# Patient Record
Sex: Male | Born: 1995 | Race: White | Hispanic: No | Marital: Single | Smoking: Former smoker
Health system: Southern US, Community
[De-identification: ages and names within clinical notes are randomized; demographics above are authoritative.]

## PROBLEM LIST (undated history)

## (undated) DIAGNOSIS — T7840XA Allergy, unspecified, initial encounter: Secondary | ICD-10-CM

## (undated) DIAGNOSIS — J45909 Unspecified asthma, uncomplicated: Secondary | ICD-10-CM

## (undated) DIAGNOSIS — F988 Other specified behavioral and emotional disorders with onset usually occurring in childhood and adolescence: Secondary | ICD-10-CM

## (undated) DIAGNOSIS — R51 Headache: Secondary | ICD-10-CM

## (undated) HISTORY — DX: Headache: R51

## (undated) HISTORY — PX: TYMPANOSTOMY TUBE PLACEMENT: SHX32

## (undated) HISTORY — PX: URETHRAL STRICTURE DILATATION: SHX477

## (undated) HISTORY — DX: Allergy, unspecified, initial encounter: T78.40XA

## (undated) HISTORY — DX: Unspecified asthma, uncomplicated: J45.909

## (undated) HISTORY — DX: Other specified behavioral and emotional disorders with onset usually occurring in childhood and adolescence: F98.8

## (undated) HISTORY — PX: OTHER SURGICAL HISTORY: SHX169

---
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
## 2016-03-29 ENCOUNTER — Telehealth: Payer: Self-pay

## 2016-03-29 NOTE — Telephone Encounter (Signed)
Spoke with patients mother. Faxed [facility]. Receptionist with [facility] stated to request ASAP.

## 2016-03-30 ENCOUNTER — Encounter: Payer: Self-pay | Admitting: Medical

## 2016-03-30 ENCOUNTER — Ambulatory Visit (INDEPENDENT_AMBULATORY_CARE_PROVIDER_SITE_OTHER): Payer: No Typology Code available for payment source | Admitting: Medical

## 2016-03-30 VITALS — BP 120/80 | HR 89 | Temp 98.1°F | Ht 62.0 in | Wt 142.2 lb

## 2016-03-30 DIAGNOSIS — F988 Other specified behavioral and emotional disorders with onset usually occurring in childhood and adolescence: Secondary | ICD-10-CM

## 2016-03-30 DIAGNOSIS — J309 Allergic rhinitis, unspecified: Secondary | ICD-10-CM

## 2016-03-30 DIAGNOSIS — G4700 Insomnia, unspecified: Secondary | ICD-10-CM

## 2016-03-30 DIAGNOSIS — F909 Attention-deficit hyperactivity disorder, unspecified type: Secondary | ICD-10-CM

## 2016-03-30 DIAGNOSIS — Z8709 Personal history of other diseases of the respiratory system: Secondary | ICD-10-CM

## 2016-03-30 DIAGNOSIS — Z5181 Encounter for therapeutic drug level monitoring: Secondary | ICD-10-CM

## 2016-03-30 LAB — COMPREHENSIVE METABOLIC PANEL
ALBUMIN: 4.6 g/dL (ref 3.5–5.2)
ALT: 84 U/L — ABNORMAL HIGH (ref 0–53)
AST: 41 U/L — ABNORMAL HIGH (ref 0–37)
Alkaline Phosphatase: 131 U/L (ref 52–171)
BILIRUBIN TOTAL: 1.1 mg/dL (ref 0.2–1.2)
BUN: 16 mg/dL (ref 6–23)
CALCIUM: 9.6 mg/dL (ref 8.4–10.5)
CHLORIDE: 103 meq/L (ref 96–112)
CO2: 30 meq/L (ref 19–32)
CREATININE: 0.76 mg/dL (ref 0.40–1.50)
GFR: 140.22 mL/min (ref >60.00)
Glucose, Bld: 70 mg/dL (ref 70–99)
Potassium: 3.8 mEq/L (ref 3.5–5.1)
Sodium: 140 mEq/L (ref 135–145)
Total Protein: 7.3 g/dL (ref 6.0–8.3)

## 2016-03-30 MED ORDER — FEXOFENADINE HCL 60 MG PO TABS: 60.0000 mg | ORAL_TABLET | Freq: Every day | ORAL | Status: DC

## 2016-03-30 MED ORDER — FLUTICASONE PROPIONATE 50 MCG/ACT NA SUSP: 2.0000 | Freq: Every day | NASAL | Status: DC

## 2016-03-30 MED ORDER — MONTELUKAST SODIUM 5 MG PO CHEW: 5.0000 mg | CHEWABLE_TABLET | Freq: Every day | ORAL | Status: DC

## 2016-03-30 MED ORDER — MIRTAZAPINE 15 MG PO TABS: 22.5000 mg | ORAL_TABLET | Freq: Every day | ORAL | Status: DC

## 2016-03-30 MED ORDER — AMANTADINE HCL 100 MG PO TABS: ORAL_TABLET | ORAL | Status: DC

## 2016-03-30 MED ORDER — ALBUTEROL SULFATE HFA 108 (90 BASE) MCG/ACT IN AERS: 2.0000 | INHALATION_SPRAY | Freq: Four times a day (QID) | RESPIRATORY_TRACT | Status: AC | PRN

## 2016-03-30 NOTE — Progress Notes (Signed)
Subjective:    Patient ID: Jerome Sampson, male    DOB: 08/11/1996, 20 y.o.   MRN: [id number]  HPI  I have reviewed pt PMH, PSH, FH, Social History and Surgical History  Pt will attend [employer] school starts August 10 th. Pt exercises daily some sit ups, pulls ups and push ups, drinks occasional soda, single.   Pt in with mom. Pt used to be with behavioral health with his Medicaid. His former Psychiatrist was prescribing ADD medications(But they don't accept orange card). Pt and mom states he is on amantidine. This helps with his focus per mom and pt. Pt has no depression, no anxiety and no bi-polar. Pt describes poor concentration without his meds and severely inefficient without medications.  Per mom had used for 10 years.  Pt uses Remeron for insomnia.  Pt uses [facility] on [address].  Pt has some seasonal allergies year round. Hx of nasal polyps twice in past.   When youger had some asthma. When he gets infections or severe allergies wheezing will occur.  In past would get very mild ha. Will resolve with alleve. Rare occaional ha every 3 months at most. No associated gross motor or sensory function deficits. Per chart migraine but years since migraine type ha.   Pt not aware of community health and wellness.  [phone]      Review of Systems  Constitutional: Negative for fever, chills and fatigue.  HENT: Positive for congestion and postnasal drip. Negative for ear pain, sinus pressure and sneezing.        Some nasal congestion.  Respiratory: Negative for choking, shortness of breath and wheezing.        Stable and no wheezing for a while.  Neurological: Positive for headaches. Negative for dizziness, seizures and numbness.       Rare occasional.  Hematological: Negative for adenopathy. Does not bruise/bleed easily.  Psychiatric/Behavioral: Positive for sleep disturbance and decreased concentration. Negative for suicidal ideas, dysphoric mood and agitation. The patient is not  nervous/anxious.        Without meds.    Past Medical History  Diagnosis Date  . Headache(784.0)   . Allergy   . Asthma   . ADD (attention deficit disorder)      Social History   Social History  . Marital Status: Single    Spouse Name: N/A  . Number of Children: N/A  . Years of Education: N/A   Occupational History  . Not on file.   Social History Main Topics  . Smoking status: Passive Smoke Exposure - Never Smoker  . Smokeless tobacco: Never Used  . Alcohol Use: No  . Drug Use: No  . Sexual Activity: No   Other Topics Concern  . Not on file   Social History Narrative    Past Surgical History  Procedure Laterality Date  . Urethral stricture dilatation      Performed at 1 month of age  . Tympanostomy tube placement Bilateral     Performed at 20 year of age  . Removal of sinus polyps in 2010 & 2012      Family History  Problem Relation Age of Onset  . Migraines Mother   . Other Mother     Learning Difficulties in Math & Reading   . Migraines Brother     1 Brother has Migraines  . Stroke Paternal Grandfather     Died at 4684  . Cancer Paternal Grandmother     Died at 7763  No Known Allergies  Current Outpatient Prescriptions on File Prior to Visit  Medication Sig Dispense Refill  . Albuterol Sulfate (VENTOLIN HFA IN) Inhale into the lungs. 2 Puffs q 4-6 PRN    . fexofenadine (ALLEGRA) 60 MG tablet Take 60 mg by mouth every morning.    . fluticasone (FLONASE) 50 MCG/ACT nasal spray Place 2 sprays into the nose daily.    . mirtazapine (REMERON) 15 MG tablet Take 22.5 mg by mouth at bedtime.    . montelukast (SINGULAIR) 5 MG chewable tablet Chew 5 mg by mouth at bedtime.     No current facility-administered medications on file prior to visit.    BP 120/80 mmHg  Pulse 89  Temp(Src) 98.1 F (36.7 C) (Oral)  Ht 5\' 2"  (1.575 m)  Wt 142 lb 3.2 oz (64.501 kg)  BMI 26.00 kg/m2  SpO2 98%       Objective:   Physical Exam  General  Mental Status -  Alert. General Appearance - Well groomed. Not in acute distress.  Skin Rashes- No Rashes.  HEENT Head- Normal. Ear Auditory Canal - Left- Normal. Right - Normal.Tympanic Membrane- Left- Normal. Right- Normal. Eye Sclera/Conjunctiva- Left- Normal. Right- Normal. Nose & Sinuses Nasal Mucosa- Left-  Boggy and Congested. Right-  Boggy and  Congested.Bilateral no  maxillary and no  frontal sinus pressure. Mouth & Throat Lips: Upper Lip- Normal: no dryness, cracking, pallor, cyanosis, or vesicular eruption. Lower Lip-Normal: no dryness, cracking, pallor, cyanosis or vesicular eruption. Buccal Mucosa- Bilateral- No Aphthous ulcers. Oropharynx- No Discharge or Erythema. Tonsils: Characteristics- Bilateral- No Erythema or Congestion. Size/Enlargement- Bilateral- No enlargement. Discharge- bilateral-None.  Neck Neck- Supple. No Masses.   Chest and Lung Exam Auscultation: Breath Sounds:-Clear even and unlabored.  Cardiovascular Auscultation:Rythm- Regular, rate and rhythm. Murmurs & Other Heart Sounds:Ausculatation of the heart reveal- No Murmurs.  Lymphatic Head & Neck General Head & Neck Lymphatics: Bilateral: Description- No Localized lymphadenopathy.   Neurologic Cranial Nerve exam:- CN III-XII intact(No nystagmus), symmetric smile. Strength:- 5/5 equal and symmetric strength both upper and lower extremities.       Assessment & Plan:  For your ADD will continue the prior rx of amantidine. But want to get cmp lab today.  For allergies rx flonase, allegra and singulair.  For insomina rx of remeron.   For asthma history make albuterol available if you have wheezing flare.   Follow up in 1 month or as needed  Discussed amantidine use for ADD with our pharmacist. It can be used. [facility] pharmacy confirmed as well.

## 2016-03-30 NOTE — Addendum Note (Signed)
Addended by: Neldon LabellaMABE, HOLDEN S on: 03/30/2016 02:26 PM   Modules accepted: Kipp BroodSmartSet

## 2016-03-30 NOTE — Patient Instructions (Addendum)
For your ADD will continue the prior rx of amantidine. But want to get cmp lab today.  For allergies rx flonase, allegra and singulair.  For insomina rx of remeron.   For asthma history make albuterol available if you have wheezing flare.    Follow up in 1 month or as needed

## 2016-03-30 NOTE — Progress Notes (Signed)
Pre visit review using our clinic review tool, if applicable. No additional management support is needed unless otherwise documented below in the visit note. 

## 2016-03-31 ENCOUNTER — Telehealth: Payer: Self-pay | Admitting: Medical

## 2016-03-31 MED ORDER — MONTELUKAST SODIUM 10 MG PO TABS: 10.0000 mg | ORAL_TABLET | Freq: Every day | ORAL | Status: DC

## 2016-03-31 MED ORDER — CETIRIZINE HCL 10 MG PO TABS: 10.0000 mg | ORAL_TABLET | Freq: Every day | ORAL | Status: DC

## 2016-03-31 NOTE — Telephone Encounter (Signed)
I did rx zyrtec and singulair both 10 mg dose. Let her know he can take 10 mg singulair. Wrote 5mg  since that was what he was on before. But 10 mg would be dose for his age.

## 2016-03-31 NOTE — Telephone Encounter (Signed)
Spoke with teresa, they do not have allegra at this time and she wants to know if they can change to zyrtec and they only have 10mg  doses in the singulair. She wants to know if these can be changed for the pt. Please advise.

## 2016-03-31 NOTE — Telephone Encounter (Signed)
Caller name: Aggie Cosierheresa  [facility] pt[facility]  Call back number: [phone]    Reason for call:  Would like to discuss medication prescribed as per Aggie Cosierheresa a few changes need to be done. Please advise

## 2016-04-07 ENCOUNTER — Telehealth: Payer: Self-pay | Admitting: Medical

## 2016-04-07 DIAGNOSIS — F988 Other specified behavioral and emotional disorders with onset usually occurring in childhood and adolescence: Secondary | ICD-10-CM

## 2016-04-07 DIAGNOSIS — G4700 Insomnia, unspecified: Secondary | ICD-10-CM

## 2016-04-07 NOTE — Telephone Encounter (Signed)
I am unable to put the order in the system and I advised the pt earlier that they would have to call the number that is on the sheet for behavioral health to set up for an appointment and the mom stated that she would call the number to set up the appointment for the her son who is the patient.

## 2016-04-07 NOTE — Telephone Encounter (Signed)
Caller Name: Lafonda MossesClaud Belluomini  Relation to AV:WUJWJXpt:father  Call back number:[phone]    Reason for call:  Would like to discuss medication below. Father would like a call before 12 due to him going to work.

## 2016-04-07 NOTE — Telephone Encounter (Signed)
Spoke with Trinna PostAlex and she states that they can send the Rx out to another pharmacy that can fill the Rx. They would not be able to fill the Rx and send it to the health department.

## 2016-04-07 NOTE — Telephone Encounter (Signed)
I did write referral. But will you call pt. See if they will open up as to what other dx he has. I was told just ADD and insomina. Behavioralhealth needs more info. They might need to see me next week so we can discuss this further.

## 2016-04-07 NOTE — Telephone Encounter (Addendum)
Spoke with pt's mom and she states that her son Jerome CumminsJesse would like a referral to behavioral health in our office. Pt's mom states that the chewable for Zyrtec and Singular. Please advise if ok to change to capsule form.

## 2016-04-07 NOTE — Telephone Encounter (Signed)
Yes ok to change to capsules. Apologize for me if I wrote chewable?

## 2016-04-10 ENCOUNTER — Telehealth: Payer: Self-pay | Admitting: *Deleted

## 2016-04-10 MED FILL — MONTELUKAST SOD 10 MG TAB: 10 | 30 days supply | Qty: 30 | Fill #0

## 2016-04-10 MED FILL — ?CETIRIZINE HCL 10 MG TABLE: 10 | 30 days supply | Qty: 30 | Fill #0

## 2016-04-10 NOTE — Telephone Encounter (Signed)
Forwarded to Edward/Holden. JG//CMA 

## 2016-04-25 ENCOUNTER — Encounter: Payer: Self-pay | Admitting: Medical

## 2016-04-25 ENCOUNTER — Ambulatory Visit (INDEPENDENT_AMBULATORY_CARE_PROVIDER_SITE_OTHER): Payer: No Typology Code available for payment source | Admitting: Medical

## 2016-04-25 VITALS — BP 120/80 | HR 87 | Temp 98.2°F | Ht 62.0 in | Wt 147.8 lb

## 2016-04-25 DIAGNOSIS — F988 Other specified behavioral and emotional disorders with onset usually occurring in childhood and adolescence: Secondary | ICD-10-CM

## 2016-04-25 DIAGNOSIS — F909 Attention-deficit hyperactivity disorder, unspecified type: Secondary | ICD-10-CM

## 2016-04-25 DIAGNOSIS — J309 Allergic rhinitis, unspecified: Secondary | ICD-10-CM

## 2016-04-25 DIAGNOSIS — R748 Abnormal levels of other serum enzymes: Secondary | ICD-10-CM

## 2016-04-25 MED ORDER — AMANTADINE HCL 100 MG PO CAPS: 100.0000 mg | ORAL_CAPSULE | Freq: Two times a day (BID) | ORAL | 2 refills | Status: DC

## 2016-04-25 MED FILL — AMANTADINE 100 MG CAPSULE: 100 | 30 days supply | Qty: 60 | Fill #0

## 2016-04-25 NOTE — Progress Notes (Signed)
Pre visit review using our clinic review tool, if applicable. No additional management support is needed unless otherwise documented below in the visit note. 

## 2016-04-25 NOTE — Addendum Note (Signed)
Addended by: Neldon Labella on: 04/25/2016 10:52 AM   Modules accepted: Orders

## 2016-04-25 NOTE — Addendum Note (Signed)
Addended by: Gwenevere Abbot on: 04/25/2016 10:43 AM   Modules accepted: Orders

## 2016-04-25 NOTE — Patient Instructions (Addendum)
For your ADD I will rx amantadine same dose but less frequency ddue to cost factors and potential side effects.  I want you to contact behavioral health and try to schedule a appointment.  For allergies continue flonase and singulair. Stop zyrtec. Use trial of otc xyzal.  Repeat cmp in 3 months since on amantadine. Sooner if signs or symptoms as explained.  Follow up 3 months or as needed

## 2016-04-25 NOTE — Progress Notes (Signed)
Subjective:    Patient ID: Jerome Sampson, male    DOB: 08-16-1996, 20 y.o.   MRN: [id number]  HPI  Pt in for follow up.  Pt has ADD. He was given amantadine for ADD in pat. Parent states during the day his focus is good. But late in the day his attention is bad. He can't stay on task. Very hyperactive now. Pt was given amantadine by former psychiatrist. Pt cost for med would be $200 per month now. On discussion with dad he only have ADD and learning disability. Pt dad states he is not hyperactive. Dad remembers that adderal when he was younger was just too much for him. Seemed to overstimulate him per dad.  I did review of his cmp since he was on amantadine. Mild/faint lft elevation  Dad in light of cost and slight liver enzymes wants lower dose of amantadine. Pt is in agreement with this as well.   Dad is hoping he will get him on insurance and a job. Some concern of dad that  without amantadine. He won't concentrate and then won't be able to work.   Pt also needs refill of singulair. He thinks singulair helps. Pt not sure if he needs zyrtec helps. Pt is using flonase as well. Some clearing of throat and pnd.   Review of Systems  Constitutional: Negative for chills, fatigue and fever.  HENT: Positive for postnasal drip. Negative for congestion, ear pain, rhinorrhea and sinus pressure.        Some pnd and clearing of throat.  Respiratory: Negative for cough, chest tightness, shortness of breath and wheezing.   Cardiovascular: Negative for chest pain and palpitations.  Gastrointestinal: Negative for abdominal pain.  Musculoskeletal: Negative for back pain.  Skin: Negative for rash.  Neurological: Negative for dizziness, seizures, syncope, weakness and numbness.  Hematological: Negative for adenopathy. Does not bruise/bleed easily.  Psychiatric/Behavioral: Positive for decreased concentration. Negative for agitation, behavioral problems, dysphoric mood, hallucinations, sleep disturbance  and suicidal ideas. The patient is not nervous/anxious.     Past Medical History:  Diagnosis Date  . ADD (attention deficit disorder)   . Allergy   . Asthma   . Headache(784.0)      Social History   Social History  . Marital status: Single    Spouse name: N/A  . Number of children: N/A  . Years of education: N/A   Occupational History  . Not on file.   Social History Main Topics  . Smoking status: Passive Smoke Exposure - Never Smoker  . Smokeless tobacco: Never Used  . Alcohol use No  . Drug use: No  . Sexual activity: No   Other Topics Concern  . Not on file   Social History Narrative  . No narrative on file    Past Surgical History:  Procedure Laterality Date  . Removal of Sinus Polyps in 2010 & 2012    . TYMPANOSTOMY TUBE PLACEMENT Bilateral    Performed at 20 year of age  . URETHRAL STRICTURE DILATATION     Performed at 1 month of age    Family History  Problem Relation Age of Onset  . Migraines Mother   . Other Mother     Learning Difficulties in Math & Reading   . Migraines Brother     1 Brother has Migraines  . Stroke Paternal Grandfather     Died at 11  . Cancer Paternal Grandmother     Died at 63    No Known  Allergies  Current Outpatient Prescriptions on File Prior to Visit  Medication Sig Dispense Refill  . albuterol (PROVENTIL HFA;VENTOLIN HFA) 108 (90 Base) MCG/ACT inhaler Inhale 2 puffs into the lungs every 6 (six) hours as needed for wheezing or shortness of breath. 1 Inhaler 0  . Amantadine HCl 100 MG tablet 2 tab po am. 1 tabs at lunch, and 1 tabs late afternoon 5 pm 120 tablet 0  . cetirizine (ZYRTEC) 10 MG tablet Take 1 tablet (10 mg total) by mouth daily. 30 tablet 3  . fluticasone (FLONASE) 50 MCG/ACT nasal spray Place 2 sprays into both nostrils daily. 16 g 3  . mirtazapine (REMERON) 15 MG tablet Take 1.5 tablets (22.5 mg total) by mouth at bedtime. 45 tablet 3  . montelukast (SINGULAIR) 10 MG tablet Take 1 tablet (10 mg total)  by mouth at bedtime. 30 tablet 3   No current facility-administered medications on file prior to visit.     BP 120/80 (BP Location: Right Arm, Patient Position: Sitting, Cuff Size: Normal)   Pulse 87   Temp 98.2 F (36.8 C) (Oral)   Ht 5\' 2"  (1.575 m)   Wt 147 lb 12.8 oz (67 kg)   SpO2 98%   BMI 27.03 kg/m       Objective:   Physical Exam  General Mental Status- Alert. General Appearance- Not in acute distress.   Skin General: Color- Normal Color. Moisture- Normal Moisture.  Neck Carotid Arteries- Normal color. Moisture- Normal Moisture. No carotid bruits. No JVD.  Chest and Lung Exam Auscultation: Breath Sounds:-Normal.  Cardiovascular Auscultation:Rythm- Regular. Murmurs & Other Heart Sounds:Auscultation of the heart reveals- No Murmurs.  Abdomen Inspection:-Inspeection Normal. Palpation/Percussion:Note:No mass. Palpation and Percussion of the abdomen reveal- Non Tender, Non Distended + BS, no rebound or guarding.    Neurologic Cranial Nerve exam:- CN III-XII intact(No nystagmus), symmetric smile. Strength:- 5/5 equal and symmetric strength both upper and lower extremities.      Assessment & Plan:  For your ADD I will rx amantadine same dose but less frequency due to cost factors and potential side effects.  I want you to contact behavioral health and try to schedule a appointment.  For allergies continue flonase and singulair. Stop zyrtec. Use trial of otc xyzal.  Repeat cmp in 3 months since on amantadine. Sooner if signs or symptoms as explained.  Follow up 3 months or as needed  Lasonya Hubner, Ramon Dredge, [employer]

## 2016-05-18 ENCOUNTER — Encounter: Payer: Self-pay | Admitting: Medical

## 2016-05-18 ENCOUNTER — Ambulatory Visit (HOSPITAL_BASED_OUTPATIENT_CLINIC_OR_DEPARTMENT_OTHER)
Admission: RE | Admit: 2016-05-18 | Discharge: 2016-05-18 | Disposition: A | Discharge status: Home / Self Care | Payer: No Typology Code available for payment source | Source: Ambulatory Visit | Attending: Medical | Admitting: Medical

## 2016-05-18 ENCOUNTER — Telehealth: Payer: Self-pay | Admitting: Medical

## 2016-05-18 ENCOUNTER — Ambulatory Visit (INDEPENDENT_AMBULATORY_CARE_PROVIDER_SITE_OTHER): Payer: No Typology Code available for payment source | Admitting: Medical

## 2016-05-18 VITALS — BP 122/70 | HR 116 | Temp 99.5°F | Ht 63.0 in | Wt 144.0 lb

## 2016-05-18 DIAGNOSIS — N50811 Right testicular pain: Secondary | ICD-10-CM

## 2016-05-18 DIAGNOSIS — N451 Epididymitis: Secondary | ICD-10-CM

## 2016-05-18 MED ORDER — CIPROFLOXACIN HCL 500 MG PO TABS: 500.0000 mg | ORAL_TABLET | Freq: Two times a day (BID) | ORAL | 0 refills | Status: DC

## 2016-05-18 MED ORDER — LEVOCETIRIZINE DIHYDROCHLORIDE 5 MG PO TABS: 5.0000 mg | ORAL_TABLET | Freq: Every evening | ORAL | 2 refills | Status: DC

## 2016-05-18 MED FILL — LEVOCETIRIZINE 5 MG TABLET: 5 | 30 days supply | Qty: 30 | Fill #0

## 2016-05-18 MED FILL — ?CIPROFLOXACIN HCL 500MG TA: 500 | 10 days supply | Qty: 20 | Fill #0

## 2016-05-18 NOTE — Telephone Encounter (Signed)
Please schedule recheck/folllow up for this coming wed in am. He should be in office tomorrow for rocephin 1 gram injection.

## 2016-05-18 NOTE — Patient Instructions (Addendum)
Your pain is severe and increasing(concern for orchritis vs torsion?). I am trying to get you in with urologist today or at latest tomorrow.   We need to refer you but need to coordinate referral [facility]. Number is [phone]. Please call them later today to see if they have arrange.  Rx to be determined but want to get stat US first. Stay in radiology until we get results.  Follow date to be determined.  If referral to urologist delayed but pain increasing tomorrow or over weekend then ED evaluation.

## 2016-05-18 NOTE — Telephone Encounter (Signed)
Denise---please call patient and schedule follow up appt on 05/24/16 with Ramon DredgeEdward.  Nurse visit for tomorrow (05/19/16) already scheduled. Thanks.

## 2016-05-18 NOTE — Progress Notes (Signed)
Pre visit review using our clinic tool,if applicable. No additional management support is needed unless otherwise documented below in the visit note.  

## 2016-05-18 NOTE — Addendum Note (Signed)
Addended by: Gwenevere AbbotSAGUIER, Antonietta Lansdowne M on: 05/18/2016 12:45 PM   Modules accepted: Orders

## 2016-05-18 NOTE — Progress Notes (Addendum)
Subjective:    Patient ID: Jerome Sampson, male    DOB: 06/27/1996, 20 y.o.   MRN: [id number]  HPI  Pt in stating 5-6 days ago he was doing some exercises and felt some pain afterwards.  He was doing pull ups and sit ups. Some squats with bodyweight. Pt was mild and intermittent.  No pain lying flat on his back. Sometimes will hurt particular positions while sleeping.  Sitting can feel pain. Standing and walking pain is more intense.  In shower today felt sharp high level  pain and had  nausea and dry heave.    Review of Systems  Constitutional: Negative for chills and fever.  Respiratory: Negative for cough, chest tightness and wheezing.   Cardiovascular: Negative for chest pain and palpitations.  Gastrointestinal: Negative for abdominal pain.  Genitourinary: Positive for testicular pain. Negative for decreased urine volume, dysuria, flank pain, frequency, genital sores, penile pain, penile swelling and urgency.  Musculoskeletal: Negative for back pain.  Skin: Negative for rash.  Hematological: Negative for adenopathy. Does not bruise/bleed easily.    Past Medical History:  Diagnosis Date  . ADD (attention deficit disorder)   . Allergy   . Asthma   . Headache(784.0)      Social History   Social History  . Marital status: Single    Spouse name: N/A  . Number of children: N/A  . Years of education: N/A   Occupational History  . Not on file.   Social History Main Topics  . Smoking status: Former Games developermoker  . Smokeless tobacco: Never Used  . Alcohol use No  . Drug use: No  . Sexual activity: No   Other Topics Concern  . Not on file   Social History Narrative  . No narrative on file    Past Surgical History:  Procedure Laterality Date  . Removal of Sinus Polyps in 2010 & 2012    . TYMPANOSTOMY TUBE PLACEMENT Bilateral    Performed at 20 year of age  . URETHRAL STRICTURE DILATATION     Performed at 1 month of age    Family History  Problem Relation Age of  Onset  . Migraines Mother   . Other Mother     Learning Difficulties in Math & Reading   . Migraines Brother     1 Brother has Migraines  . Stroke Paternal Grandfather     Died at 1584  . Cancer Paternal Grandmother     Died at 6263    No Known Allergies  Current Outpatient Prescriptions on File Prior to Visit  Medication Sig Dispense Refill  . albuterol (PROVENTIL HFA;VENTOLIN HFA) 108 (90 Base) MCG/ACT inhaler Inhale 2 puffs into the lungs every 6 (six) hours as needed for wheezing or shortness of breath. 1 Inhaler 0  . amantadine (SYMMETREL) 100 MG capsule Take 1 capsule (100 mg total) by mouth 2 (two) times daily. 60 capsule 2  . fluticasone (FLONASE) 50 MCG/ACT nasal spray Place 2 sprays into both nostrils daily. 16 g 3  . mirtazapine (REMERON) 15 MG tablet Take 1.5 tablets (22.5 mg total) by mouth at bedtime. 45 tablet 3  . montelukast (SINGULAIR) 10 MG tablet Take 1 tablet (10 mg total) by mouth at bedtime. 30 tablet 3  . cetirizine (ZYRTEC) 10 MG tablet Take 1 tablet (10 mg total) by mouth daily. (Patient not taking: Reported on 05/18/2016) 30 tablet 3   No current facility-administered medications on file prior to visit.     BP 122/70  Pulse (!) 116   Temp 99.5 F (37.5 C)   Ht 5\' 3"  (1.6 m)   Wt 144 lb (65.3 kg)   SpO2 100%   BMI 25.51 kg/m       Objective:   Physical Exam  General- No acute distress. Pleasant patient. Neck- Full range of motion, no jvd Lungs- Clear, even and unlabored. Heart- regular rate and rhythm. Neurologic- CNII- XII grossly intact. Abd-soft,nt, nd, +bs. No rt lower quadrant pain at all. No heel jar pain   Genital exam- uncircumcised. Rt testicle feels very swollen. Very tender to light  palpation. Can't  access the inguinal canal due to  Large size of testicle and pain. Lt testicle normal in size.       Assessment & Plan:  Your pain is severe and increasing(concern for orchitis vs torsion?). I am trying to get you in with urologist  today or at latest tomorrow.   We need to refer you but need to coordinate referral [facility]. Number is [phone]. Please call them later today to see if they have arrange.  Rx to be determined but want to get stat US first. Stay in radiology until we get results.  Follow date to be determined.  If referral to urologist delayed but pain increasing tomorrow or over weekend then ED evaluation.  Pt US finding consistent with epididymitis. Will rx cipro and diclofenac. Wanted to give rocephin 1 gram im today in office. But they can't do wait time since his ride/grandad will be late to work so he will get rocephin tomorrow am. Still advised in event testicle pain worsens despite treatment then ED evaluation. [facility] did not set urologist appointment up yet. So will ask Victorino DikeJennifer to put that on hold. He may not need. Would refer if he does not respond to above treatment.  Sidnee Gambrill, Ramon DredgeEdward, PA-C

## 2016-05-19 ENCOUNTER — Ambulatory Visit (INDEPENDENT_AMBULATORY_CARE_PROVIDER_SITE_OTHER): Payer: No Typology Code available for payment source | Admitting: Behavioral Health

## 2016-05-19 DIAGNOSIS — N451 Epididymitis: Secondary | ICD-10-CM

## 2016-05-19 MED ORDER — CEFTRIAXONE SODIUM 1 G IJ SOLR
1.0000 g | Freq: Once | INTRAMUSCULAR | Status: AC
  Administered 2016-05-19: 1 g via INTRAMUSCULAR

## 2016-05-19 NOTE — Progress Notes (Signed)
Pre visit review using our clinic review tool, if applicable. No additional management support is needed unless otherwise documented below in the visit note.  Patient in clinic today for Rocephin injection per telephone note 05/18/16. Patient tolerated injection well and has been scheduled for a follow-up visit with Esperanza RichtersEdward Saguier, PA-C on 05/24/16 at 9:00 AM.

## 2016-05-22 NOTE — Telephone Encounter (Signed)
Appt scheduled by Jannet Askewonnie Byrd, RN during nurse visit.

## 2016-05-24 ENCOUNTER — Telehealth: Payer: Self-pay | Admitting: Medical

## 2016-05-24 ENCOUNTER — Ambulatory Visit: Payer: No Typology Code available for payment source | Admitting: Medical

## 2016-05-24 NOTE — Telephone Encounter (Signed)
Called patient regarding no show policy. Left message regarding how he was feeling and if he needed to reschedule.

## 2016-05-24 NOTE — Telephone Encounter (Signed)
No charge but would someone but would someone kindely remind of no show policy and see how he is doing?  Ebony note above is no charge.

## 2016-05-24 NOTE — Telephone Encounter (Signed)
Patient lvm 05/24/16 at 10:14am cancelling 9am appointment for today.charge or no charge

## 2016-05-26 MED FILL — AMANTADINE 100 MG CAPSULE: 100 | 30 days supply | Qty: 60 | Fill #1

## 2016-05-26 NOTE — Telephone Encounter (Signed)
Spoke with patient,states he is feeling better and swelling gone down in testicle

## 2016-06-08 ENCOUNTER — Telehealth: Payer: Self-pay | Admitting: Medical

## 2016-06-08 NOTE — Telephone Encounter (Signed)
Caller name: Claud Relationship to patient: Father Can be reached: [phone]  Ph[facility]:  [facility] [place] [phone] (Phone) [phone] (Fax)     Reason for call: Father called stating that the patient is experiencing the same symptoms that he was seen for by Ramon DredgeEdward 3 weeks ago. He appears to he has completed the antibiotics and wants to know what to do. Plse adv

## 2016-06-09 ENCOUNTER — Ambulatory Visit (INDEPENDENT_AMBULATORY_CARE_PROVIDER_SITE_OTHER): Payer: No Typology Code available for payment source | Admitting: Medical

## 2016-06-09 ENCOUNTER — Encounter: Payer: Self-pay | Admitting: Medical

## 2016-06-09 VITALS — BP 122/66 | HR 93 | Temp 99.0°F | Ht 63.0 in | Wt 141.2 lb

## 2016-06-09 DIAGNOSIS — N451 Epididymitis: Secondary | ICD-10-CM

## 2016-06-09 MED ORDER — CEFTRIAXONE SODIUM 1 G IJ SOLR
1.0000 g | Freq: Once | INTRAMUSCULAR | Status: AC
  Administered 2016-06-09: 1 g via INTRAMUSCULAR

## 2016-06-09 MED ORDER — MIRTAZAPINE 15 MG PO TABS: 22.5000 mg | ORAL_TABLET | Freq: Every day | ORAL | 3 refills | Status: DC

## 2016-06-09 MED ORDER — CIPROFLOXACIN HCL 500 MG PO TABS: 500.0000 mg | ORAL_TABLET | Freq: Two times a day (BID) | ORAL | 0 refills | Status: DC

## 2016-06-09 MED FILL — CIPROFLOXACIN HCL 500 MG TA: 500 | 10 days supply | Qty: 20 | Fill #0

## 2016-06-09 NOTE — Progress Notes (Signed)
Subjective:    Patient ID: Jerome Sampson, male    DOB: Feb 06, 1996, 20 y.o.   MRN: [id number]  HPI   Pt in for follow up.   Pt had some epididymitis. I gave rocephin and cipro antibiotic. Pt states he felt almost 100% better but then last 3 days ago he has had recurrent mild pain. He is concerned for reoccurence.  Pt had [place] studies and consistent with epididymitis. Mild pain presently.  No torsion seen on last [place].  [facility] pt remeron. [facility]    Review of Systems  Constitutional: Positive for chills. Negative for fatigue and fever.       Maybe mild cold chill yesterday.  Respiratory: Negative for chest tightness, shortness of breath and wheezing.   Cardiovascular: Negative for chest pain and palpitations.  Gastrointestinal: Negative for abdominal pain and nausea.  Musculoskeletal: Negative for back pain and neck stiffness.  Skin: Negative for rash.  Neurological: Negative for dizziness, syncope, speech difficulty, numbness and headaches.  Hematological: Negative for adenopathy. Does not bruise/bleed easily.  Psychiatric/Behavioral: Negative for behavioral problems and confusion.    Past Medical History:  Diagnosis Date  . ADD (attention deficit disorder)   . Allergy   . Asthma   . Headache(784.0)      Social History   Social History  . Marital status: Single    Spouse name: N/A  . Number of children: N/A  . Years of education: N/A   Occupational History  . Not on file.   Social History Main Topics  . Smoking status: Former Games developer  . Smokeless tobacco: Never Used  . Alcohol use No  . Drug use: No  . Sexual activity: No   Other Topics Concern  . Not on file   Social History Narrative  . No narrative on file    Past Surgical History:  Procedure Laterality Date  . Removal of Sinus Polyps in 2010 & 2012    . TYMPANOSTOMY TUBE PLACEMENT Bilateral    Performed at 20 year of  age  . URETHRAL STRICTURE DILATATION     Performed at 1 month of age    Family History  Problem Relation Age of Onset  . Migraines Mother   . Other Mother     Learning Difficulties in Math & Reading   . Migraines Brother     1 Brother has Migraines  . Stroke Paternal Grandfather     Died at 31  . Cancer Paternal Grandmother     Died at 58    No Known Allergies  Current Outpatient Prescriptions on File Prior to Visit  Medication Sig Dispense Refill  . albuterol (PROVENTIL HFA;VENTOLIN HFA) 108 (90 Base) MCG/ACT inhaler Inhale 2 puffs into the lungs every 6 (six) hours as needed for wheezing or shortness of breath. 1 Inhaler 0  . amantadine (SYMMETREL) 100 MG capsule Take 1 capsule (100 mg total) by mouth 2 (two) times daily. 60 capsule 2  . fluticasone (FLONASE) 50 MCG/ACT nasal spray Place 2 sprays into both nostrils daily. 16 g 3  . levocetirizine (XYZAL) 5 MG tablet Take 1 tablet (5 mg total) by mouth every evening. generic 30 tablet 2  . mirtazapine (REMERON) 15 MG tablet Take 1.5 tablets (22.5 mg total) by mouth at bedtime. 45 tablet 3  . montelukast (SINGULAIR) 10 MG tablet Take 1 tablet (10 mg total) by mouth at bedtime. 30 tablet 3  .  ciprofloxacin (CIPRO) 500 MG tablet Take 1 tablet (500 mg total) by mouth 2 (two) times daily. (Patient not taking: Reported on 06/09/2016) 20 tablet 0   No current facility-administered medications on file prior to visit.     BP 122/66   Pulse 93   Temp 99 F (37.2 C) (Oral)   Ht 5\' 3"  (1.6 m)   Wt 141 lb 3.2 oz (64 kg)   SpO2 100%   BMI 25.01 kg/m       Objective:   Physical Exam  General Appearance- Not in acute distress.  HEENT Eyes- Scleraeral/Conjuntiva-bilat- Not Yellow. Mouth & Throat- Normal.  Chest and Lung Exam Auscultation: Breath sounds:-Normal. Adventitious sounds:- No Adventitious sounds.  Cardiovascular Auscultation:Rythm - Regular. Heart Sounds -Normal heart sounds.  Abdomen Inspection:-Inspection  Normal.  Palpation/Perucssion: Palpation and Percussion of the abdomen reveal- Non Tender, No Rebound tenderness, No rigidity(Guarding) and No Palpable abdominal masses.  Liver:-Normal.  Spleen:- Normal.   Back - no cva pain  Genital- rt testicle feels faint enlarged. Moderate enlarged and tender epididymitis. Left side normal.  .      Assessment & Plan:  For recurrent vs persisting epididymitis but not to same degree will give your rocephin 1 gram again and rx cipro antibiotic.   Will refer to urologist.  During interim if any severe testicle pain then ED evaluation.  Sent remeron at pt request since they can't contact pharmacy [facility] health dept.  Follow up in 10 day or as needed

## 2016-06-09 NOTE — Addendum Note (Signed)
Addended by: Gwenevere AbbotSAGUIER, Telvin Reinders M on: 06/09/2016 11:39 AM   Modules accepted: Orders

## 2016-06-09 NOTE — Patient Instructions (Signed)
For recurrent vs persisting epididymitis but not to same degree will give your rocephin 1 gram again and rx cipro antibiotic.   Will refer to urologist.  During interim if any severe testicle pain then ED evaluation.  Follow up in 10 day or as needed

## 2016-06-09 NOTE — Telephone Encounter (Signed)
Patient in today for OV.

## 2016-06-15 MED FILL — ?MIRTAZAPINE 15 MG TABLET: 15 | 30 days supply | Qty: 45 | Fill #0

## 2016-06-26 ENCOUNTER — Other Ambulatory Visit (HOSPITAL_COMMUNITY)
Admission: RE | Admit: 2016-06-26 | Discharge: 2016-06-26 | Disposition: A | Discharge status: Home / Self Care | Payer: No Typology Code available for payment source | Source: Ambulatory Visit | Attending: Medical | Admitting: Medical

## 2016-06-26 ENCOUNTER — Ambulatory Visit (INDEPENDENT_AMBULATORY_CARE_PROVIDER_SITE_OTHER): Payer: No Typology Code available for payment source | Admitting: Medical

## 2016-06-26 ENCOUNTER — Other Ambulatory Visit: Payer: Self-pay

## 2016-06-26 ENCOUNTER — Telehealth: Payer: Self-pay | Admitting: Medical

## 2016-06-26 ENCOUNTER — Encounter: Payer: Self-pay | Admitting: Medical

## 2016-06-26 VITALS — BP 115/61 | HR 84 | Temp 98.4°F | Ht 63.0 in | Wt 138.6 lb

## 2016-06-26 DIAGNOSIS — Z113 Encounter for screening for infections with a predominantly sexual mode of transmission: Secondary | ICD-10-CM | POA: Insufficient documentation

## 2016-06-26 DIAGNOSIS — N451 Epididymitis: Secondary | ICD-10-CM

## 2016-06-26 DIAGNOSIS — R300 Dysuria: Secondary | ICD-10-CM

## 2016-06-26 LAB — POCT URINALYSIS DIPSTICK
Bilirubin, UA: NEGATIVE
Glucose, UA: NEGATIVE
KETONES UA: NEGATIVE
Nitrite, UA: NEGATIVE
PH UA: 6
UROBILINOGEN UA: 4

## 2016-06-26 MED ORDER — CIPROFLOXACIN HCL 500 MG PO TABS: 500.0000 mg | ORAL_TABLET | Freq: Two times a day (BID) | ORAL | 0 refills | Status: AC

## 2016-06-26 MED ORDER — CEFTRIAXONE SODIUM 1 G IJ SOLR: 1.0000 g | Freq: Once | INTRAMUSCULAR | 0 refills | Status: AC

## 2016-06-26 MED FILL — AMANTADINE 100 MG CAPSULE: 100 | 30 days supply | Qty: 60 | Fill #2

## 2016-06-26 MED FILL — LEVOCETIRIZINE 5 MG TABLET: 5 | 30 days supply | Qty: 30 | Fill #1

## 2016-06-26 NOTE — Telephone Encounter (Signed)
Patient on schedule to see provider today.

## 2016-06-26 NOTE — Telephone Encounter (Signed)
Per last note on 06/09/16, pt was to follow-up in 10 days. Given recurring symptoms, would recommend he schedule appt w/ PCP.

## 2016-06-26 NOTE — Progress Notes (Signed)
Subjective:    Patient ID: Jerome Sampson, male    DOB: 01/25/1996, 20 y.o.   MRN: [id number]  HPI   Pt in for follow up.  Pt has had mostly resolved rt testicle pain. See those prior notes. Pt had epididymitis type symptoms in the pas rt testicle t. US test was negative except for probable epididymitis. He had some recurrent symptoms consistent with epididymitis. I put in referrals and appears the referral was delayed.  So rt side does feel better over all. But the left side is testicle has been hurting 3-4 days ago. First day pain was worse. Pain still present but not as severe. Pain mild presently.   Pt had some pain on urination today. Just a little bit. At beginning pain was worse. Today pain is less.     Review of Systems  Constitutional: Negative for chills, fatigue and fever.  Respiratory: Negative for cough, chest tightness and wheezing.   Cardiovascular: Negative for chest pain and palpitations.  Genitourinary: Positive for dysuria, frequency and testicular pain. Negative for decreased urine volume, difficulty urinating, flank pain, genital sores, penile pain, penile swelling, scrotal swelling and urgency.  Musculoskeletal: Negative for back pain.  Psychiatric/Behavioral: Negative for behavioral problems and confusion.    Past Medical History:  Diagnosis Date  . ADD (attention deficit disorder)   . Allergy   . Asthma   . Headache(784.0)      Social History   Social History  . Marital status: Single    Spouse name: N/A  . Number of children: N/A  . Years of education: N/A   Occupational History  . Not on file.   Social History Main Topics  . Smoking status: Former Games developermoker  . Smokeless tobacco: Never Used  . Alcohol use No  . Drug use: No  . Sexual activity: No   Other Topics Concern  . Not on file   Social History Narrative  . No narrative on file    Past Surgical History:  Procedure Laterality Date  . Removal of Sinus Polyps in 2010 & 2012    .  TYMPANOSTOMY TUBE PLACEMENT Bilateral    Performed at 20 year of age  . URETHRAL STRICTURE DILATATION     Performed at 1 month of age    Family History  Problem Relation Age of Onset  . Migraines Mother   . Other Mother     Learning Difficulties in Math & Reading   . Migraines Brother     1 Brother has Migraines  . Stroke Paternal Grandfather     Died at 1684  . Cancer Paternal Grandmother     Died at 3763    No Known Allergies  Current Outpatient Prescriptions on File Prior to Visit  Medication Sig Dispense Refill  . albuterol (PROVENTIL HFA;VENTOLIN HFA) 108 (90 Base) MCG/ACT inhaler Inhale 2 puffs into the lungs every 6 (six) hours as needed for wheezing or shortness of breath. 1 Inhaler 0  . amantadine (SYMMETREL) 100 MG capsule Take 1 capsule (100 mg total) by mouth 2 (two) times daily. 60 capsule 2  . ciprofloxacin (CIPRO) 500 MG tablet Take 1 tablet (500 mg total) by mouth 2 (two) times daily. 20 tablet 0  . fluticasone (FLONASE) 50 MCG/ACT nasal spray Place 2 sprays into both nostrils daily. 16 g 3  . levocetirizine (XYZAL) 5 MG tablet Take 1 tablet (5 mg total) by mouth every evening. generic 30 tablet 2  . mirtazapine (REMERON) 15 MG tablet Take  1.5 tablets (22.5 mg total) by mouth at bedtime. 45 tablet 3  . montelukast (SINGULAIR) 10 MG tablet Take 1 tablet (10 mg total) by mouth at bedtime. 30 tablet 3   No current facility-administered medications on file prior to visit.     BP 115/61   Pulse 84   Temp 98.4 F (36.9 C) (Oral)   Ht 5\' 3"  (1.6 m)   Wt 138 lb 9.6 oz (62.9 kg)   SpO2 100%   BMI 24.55 kg/m       Objective:   Physical Exam   General- No acute distress. Pleasant patient. Neck- Full range of motion, no jvd Lungs- Clear, even and unlabored. Heart- regular rate and rhythm. Neurologic- CNII- XII grossly intact.  Genital- faint left testicle pain.Left epididymus faint tender to palpation. Testicle overall does not feel swollen Rt side testicle  feels normal.       Assessment & Plan:  For your pain on urination and testicle pain will again give rocephin 1 gram and cipro antibiotic.  We will do urine culture and ancillary studies.   Ibuprofen for pain.  We have again called [facility] to coordinate the referral. The number to their office is [phone]. Contact person is [employer].  Follow up in 7-10 days or as needed  Today decided not to get left side [place] since his pain has improved since onset and minimal. Physical exam also relatively normal. But if increased pain severe then ED. If pain gradual increase then outpatient [place].  Ambriella Kitt, Ramon Dredge, PA-C

## 2016-06-26 NOTE — Telephone Encounter (Signed)
Caller name: Claud Relationship to patient: father Can be reached: either ph# in account, please try both if no answer at first call  Reason for call: pt father called stating pt feels he is getting a 3rd infection in testicle. Pt is worried about how this may affect him in the future as well. [facility] as pt father states they have not received a call. Should pt come in office for appt today with Ramon DredgeEdward?

## 2016-06-26 NOTE — Patient Instructions (Addendum)
For your pain on urination and testicle pain will again give rocephin 1 gram and cipro antibiotic.  We will do urine culture and ancillary studies.   Ibuprofen for pain.  We have again called [facility] to coordinate the referral. The numbe to their office is [phone]. Contact person is [employer] Staley.  Today decided not to get left side US since your  pain has improved since onset and minimal. Physical exam also relatively normal. But if increased pain severe then ED. If pain gradual increase then outpatient US.   Follow up in 7-10 days or as needed

## 2016-06-26 NOTE — Progress Notes (Signed)
Pre visit review using our clinic tool,if applicable. No additional management support is needed unless otherwise documented below in the visit note.  

## 2016-06-26 NOTE — Telephone Encounter (Signed)
msg was left on home # and cell # for pt to call in and schedule OV with Ramon DredgeEdward for recurring sx. I have also left msg and [facility]ed [facility]

## 2016-06-27 ENCOUNTER — Telehealth: Payer: Self-pay | Admitting: Medical

## 2016-06-27 MED ORDER — CEFTRIAXONE SODIUM 1 G IJ SOLR
1.0000 g | Freq: Once | INTRAMUSCULAR | Status: AC
  Administered 2016-06-19: 1 g via INTRAMUSCULAR

## 2016-06-27 MED FILL — CIPROFLOXACIN HCL 500 MG TA: 500 | 10 days supply | Qty: 20 | Fill #0

## 2016-06-27 NOTE — Addendum Note (Signed)
Addended by: Lurline HareARTER, Blake Vetrano E on: 06/27/2016 08:05 AM   Modules accepted: Orders

## 2016-06-27 NOTE — Telephone Encounter (Signed)
I have left a msg at [phone] for Kim, pts mom & a msg on pts cell phone. This is the correct contact # [facility] and they can leave a msg for RipleyStephanie. It is not a # that goes to a direct person.

## 2016-06-27 NOTE — Telephone Encounter (Signed)
-----   Message from Elliot Gaultiffany M Bell sent at 06/27/2016  9:45 AM EDT ----- Regarding: Jerome Sampson referral / [facility]  Contact: [phone] As per mother [facility]'t reach [facility] [phone] her son was referred. Please advise if theres an alternate #

## 2016-06-27 NOTE — Telephone Encounter (Signed)
Called pharmacy,states they just processd Cipro RX.

## 2016-06-27 NOTE — Telephone Encounter (Signed)
Caller name: Cindy Hazyatricia Ramp  Relation to pt: mother  Call back number: [facility] [facility] [place] [phone] ([phone] (Fax)    Reason for call:  Father is currently at pharmacy and as per pharmacy ciprofloxacin (CIPRO) 500 MG tablet was not received requesting office to contact to pharmacy directly. Please advise

## 2016-06-28 LAB — URINE CYTOLOGY ANCILLARY ONLY
CHLAMYDIA, DNA PROBE: NEGATIVE
Neisseria Gonorrhea: NEGATIVE
TRICH (WINDOWPATH): NEGATIVE

## 2016-06-28 NOTE — Telephone Encounter (Signed)
Received VM from [place] [facility]. She talked to referral coordinator. We sent referral 8/17 and [facility] week later [facility]. We sent a new referral 06/09/16 and that was sent to [facility]. They have to review and notify [facility] of approval or denial. She will have Stacy, her referral coordinator, follow up with [facility] and contact us and the pts mother with an update.

## 2016-06-29 LAB — CULTURE, URINE COMPREHENSIVE: ORGANISM ID, BACTERIA: NO GROWTH

## 2016-06-29 NOTE — Telephone Encounter (Signed)
Misty StanleyStacey from [facility] called and left message on my voicemail, patient is scheduled with [facility] on 07/03/16 @11am 

## 2016-06-30 LAB — URINE CYTOLOGY ANCILLARY ONLY
Bacterial vaginitis: NEGATIVE
CANDIDA VAGINITIS: NEGATIVE

## 2016-07-03 MED FILL — SULFAMETHOXAZOLE/TMP DS TAB: 800-160 | 14 days supply | Qty: 28 | Fill #0

## 2016-07-03 MED FILL — IBUPROFEN 800 MG TABLET: 800 | 5 days supply | Qty: 15 | Fill #0

## 2016-07-17 MED FILL — ?MIRTAZAPINE 15 MG TABLET: 15 | 20 days supply | Qty: 30 | Fill #1

## 2016-07-25 ENCOUNTER — Ambulatory Visit: Payer: No Typology Code available for payment source | Admitting: Medical

## 2016-08-03 ENCOUNTER — Telehealth: Payer: Self-pay | Admitting: Medical

## 2016-08-03 DIAGNOSIS — Z5181 Encounter for therapeutic drug level monitoring: Secondary | ICD-10-CM

## 2016-08-03 NOTE — Telephone Encounter (Signed)
error:[id number] ° °

## 2016-08-03 NOTE — Telephone Encounter (Signed)
Please advise on refill.

## 2016-08-03 NOTE — Telephone Encounter (Signed)
Will you go ahead and refill pt xyzal and amantadine. Same sig with same number of refills. But also  I put future cmp order in to check liver enzymes since on amantadine. Do test in 1 months.

## 2016-08-03 NOTE — Telephone Encounter (Signed)
Father Claude-  Refill Elita BooneXYZAL , Amantadine   [facility]: [facility] [place]       [phone] if needed.

## 2016-08-04 MED ORDER — AMANTADINE HCL 100 MG PO CAPS: 100.0000 mg | ORAL_CAPSULE | Freq: Two times a day (BID) | ORAL | 0 refills | Status: DC

## 2016-08-04 MED ORDER — LEVOCETIRIZINE DIHYDROCHLORIDE 5 MG PO TABS: 5.0000 mg | ORAL_TABLET | Freq: Every evening | ORAL | 2 refills | Status: DC

## 2016-08-04 MED FILL — AMANTADINE 100 MG CAPSULE: 100 | 30 days supply | Qty: 60 | Fill #0

## 2016-08-04 NOTE — Telephone Encounter (Signed)
I spoke with the patient and advised him that his Rx would be sent to the pharmacy. Pt has a lab appointment for 09/04/16.

## 2016-09-04 ENCOUNTER — Other Ambulatory Visit: Payer: Self-pay | Admitting: Medical

## 2016-09-04 ENCOUNTER — Other Ambulatory Visit (INDEPENDENT_AMBULATORY_CARE_PROVIDER_SITE_OTHER): Payer: 59

## 2016-09-04 DIAGNOSIS — Z5181 Encounter for therapeutic drug level monitoring: Secondary | ICD-10-CM | POA: Diagnosis not present

## 2016-09-04 NOTE — Telephone Encounter (Signed)
Please advise if ok to send in refills.

## 2016-09-04 NOTE — Telephone Encounter (Signed)
°  Relation to QM:VHQIpt:self Call back [phone] Pharmacy:[facility] [address] [place]  Reason for call: pt is needing rx mirtazapine (REMERON) 15 MG tablet, amantadine (SYMMETREL) 100 MG capsule, and  levocetirizine (XYZAL) 5 MG tablet, please call to confirm rx was sent

## 2016-09-05 LAB — COMPREHENSIVE METABOLIC PANEL
ALBUMIN: 4.6 g/dL (ref 3.5–5.2)
ALK PHOS: 109 U/L (ref 52–171)
ALT: 27 U/L (ref 0–53)
AST: 25 U/L (ref 0–37)
BILIRUBIN TOTAL: 0.7 mg/dL (ref 0.2–1.2)
BUN: 20 mg/dL (ref 6–23)
CALCIUM: 9.6 mg/dL (ref 8.4–10.5)
CO2: 27 mEq/L (ref 19–32)
CREATININE: 0.83 mg/dL (ref 0.40–1.50)
Chloride: 104 mEq/L (ref 96–112)
GFR: 126.09 mL/min (ref >60.00)
Glucose, Bld: 83 mg/dL (ref 70–99)
Potassium: 4.1 mEq/L (ref 3.5–5.1)
SODIUM: 141 meq/L (ref 135–145)
TOTAL PROTEIN: 7.3 g/dL (ref 6.0–8.3)

## 2016-09-05 MED ORDER — LEVOCETIRIZINE DIHYDROCHLORIDE 5 MG PO TABS: 5.0000 mg | ORAL_TABLET | Freq: Every evening | ORAL | 2 refills | Status: DC

## 2016-09-05 MED ORDER — LEVOCETIRIZINE DIHYDROCHLORIDE 5 MG PO TABS: 5.0000 mg | ORAL_TABLET | Freq: Every evening | ORAL | 3 refills | Status: DC

## 2016-09-05 MED ORDER — AMANTADINE HCL 100 MG PO CAPS: 100.0000 mg | ORAL_CAPSULE | Freq: Two times a day (BID) | ORAL | 3 refills | Status: DC

## 2016-09-05 MED ORDER — AMANTADINE HCL 100 MG PO CAPS: 100.0000 mg | ORAL_CAPSULE | Freq: Two times a day (BID) | ORAL | 0 refills | Status: DC

## 2016-09-05 MED ORDER — MIRTAZAPINE 15 MG PO TABS: 22.5000 mg | ORAL_TABLET | Freq: Every day | ORAL | 3 refills | Status: DC

## 2016-09-05 NOTE — Telephone Encounter (Signed)
Faxed over the form to cancel the medications for the patient. [facility] [address] for the patient.  Rx faxed to pharmacy.

## 2016-09-05 NOTE — Telephone Encounter (Signed)
I refilled pt meds. Remeron, amantadine and xyzal. Will you double check and make sure [facility] [address] [place]. Cancel rx to other pharmacy if sent to wrong pharmacy.

## 2016-09-05 NOTE — Telephone Encounter (Signed)
Attempted to reach the [facility] phone is not working.

## 2016-11-16 IMAGING — US US SCROTUM
1 series · 13 of 25 positions shown · non-contrast
Comparison: None.

CLINICAL DATA: Right testicular pain and swelling for 5 days

EXAM:
SCROTAL ULTRASOUND
DOPPLER ULTRASOUND OF THE TESTICLES
TECHNIQUE: Complete ultrasound examination of the testicles, epididymis, and
other scrotal structures was performed. Color and spectral Doppler
ultrasound were also utilized to evaluate blood flow to the
testicles.

[Series 1: us scrotum · 0.08mm/px · 13 of 41 slices shown]
[im 1/41]
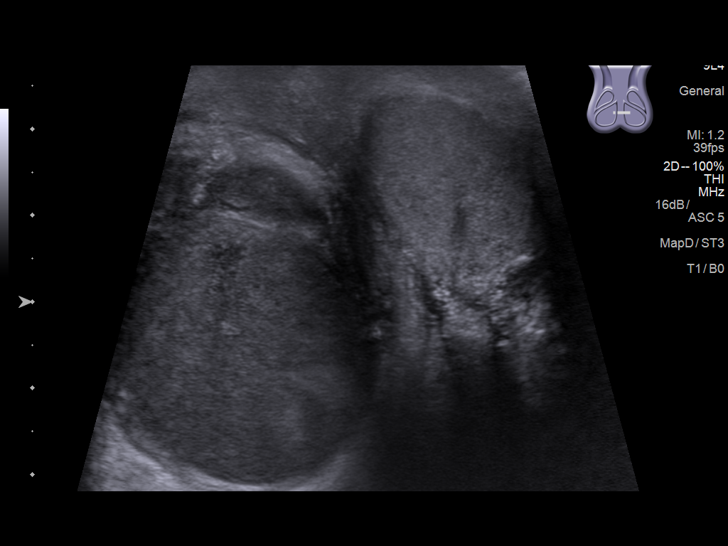
[im 4/41]
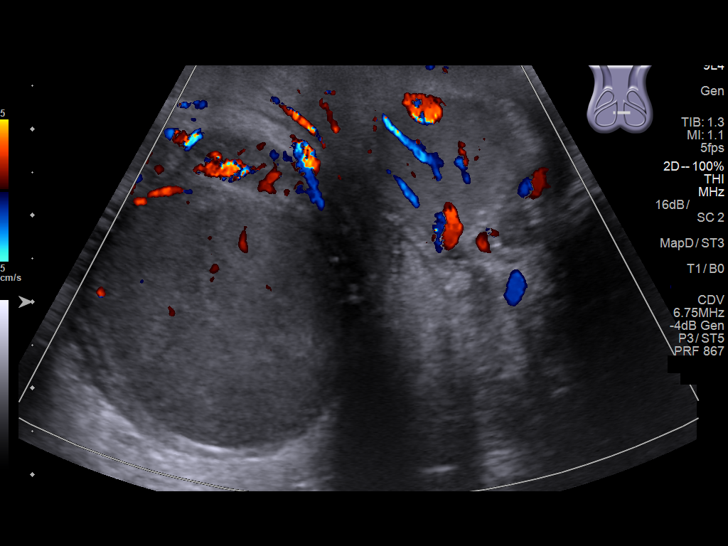
[im 7/41]
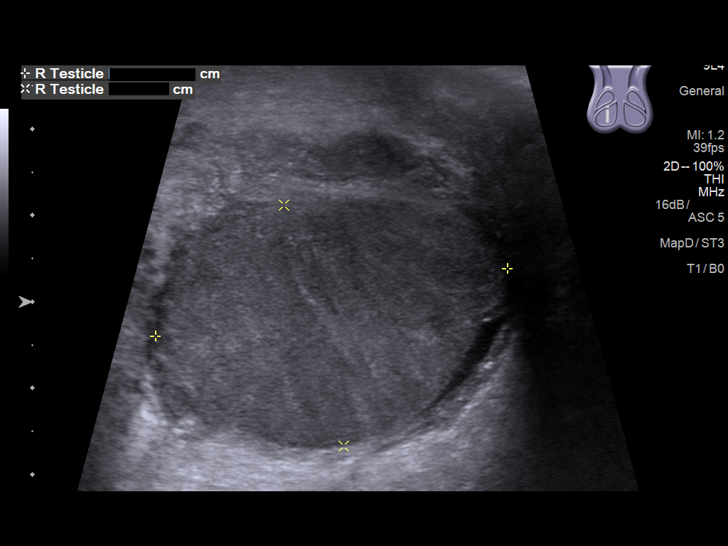
[im 11/41]
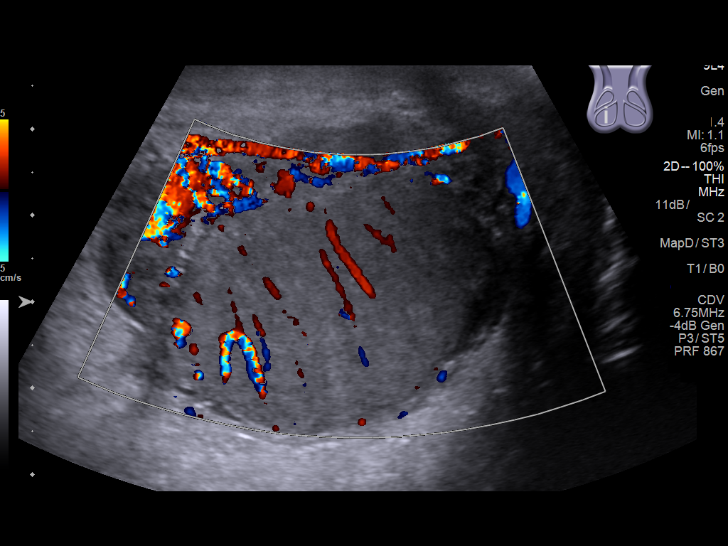
[im 14/41]
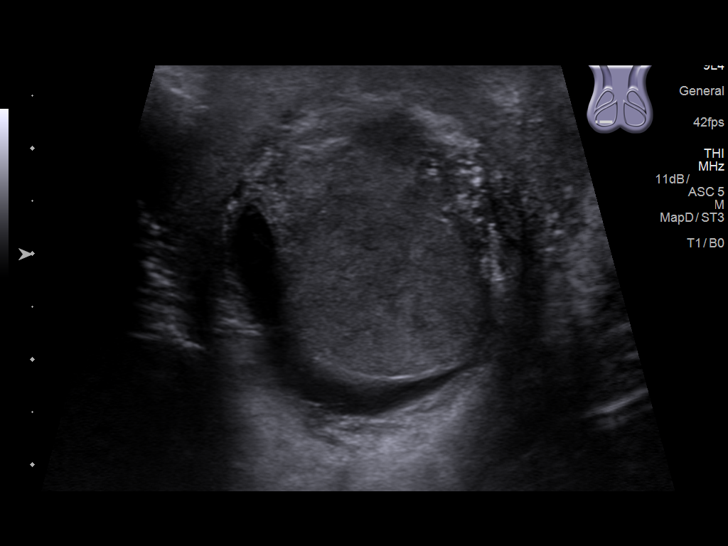
[im 17/41]
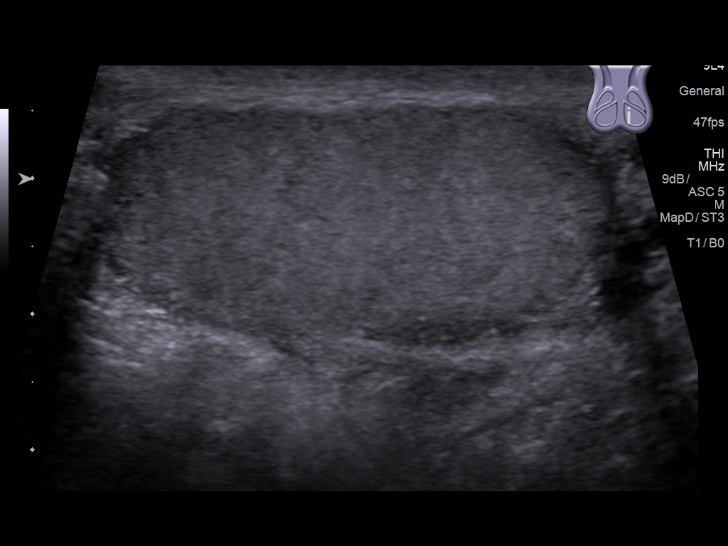
[im 21/41]
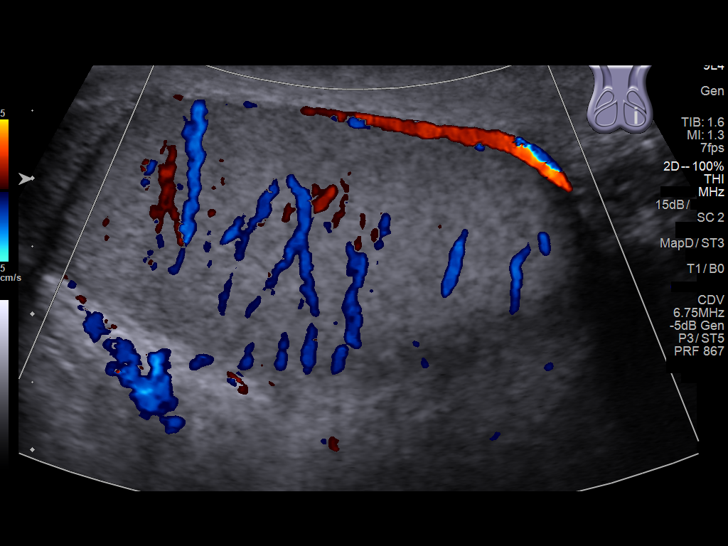
[im 24/41]
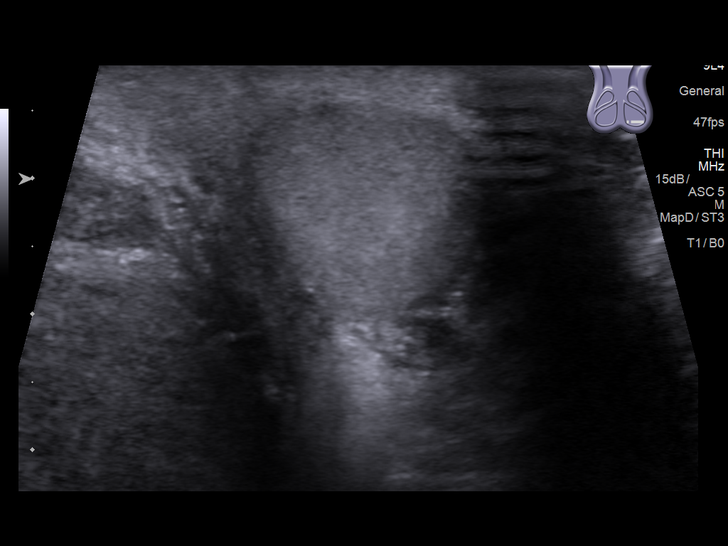
[im 27/41]
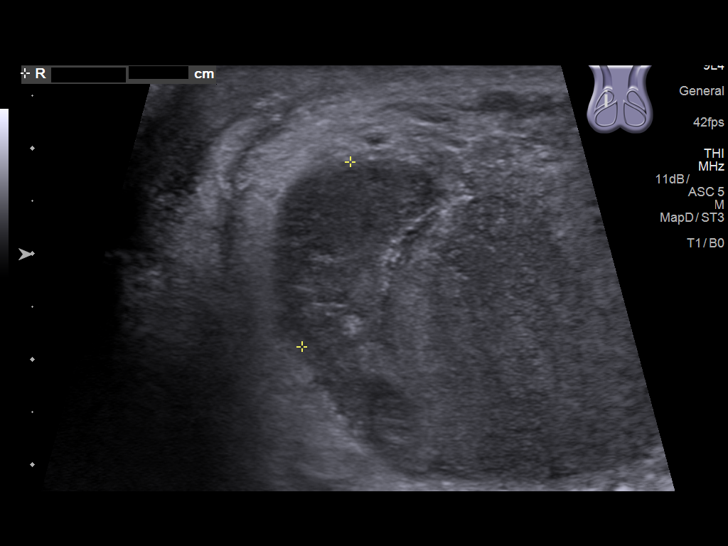
[im 31/41]
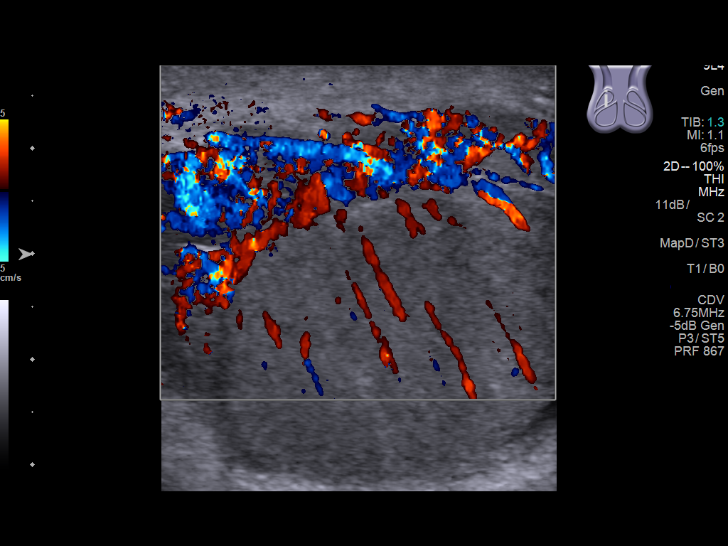
[im 34/41]
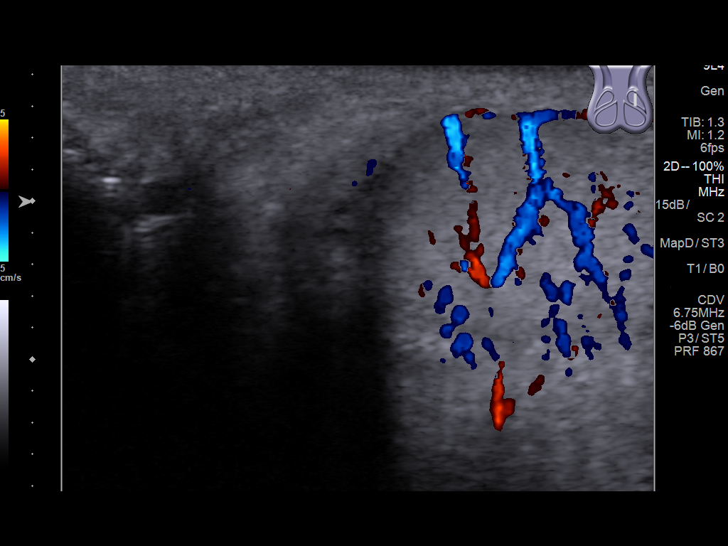
[im 37/41]
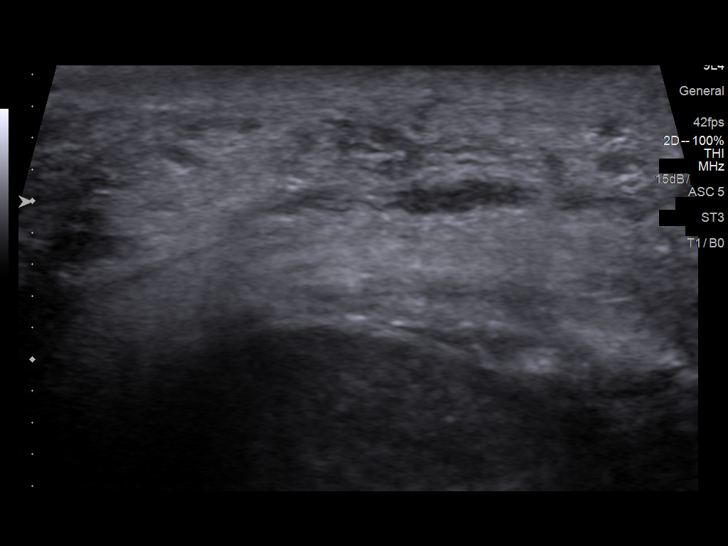
[im 41/41]
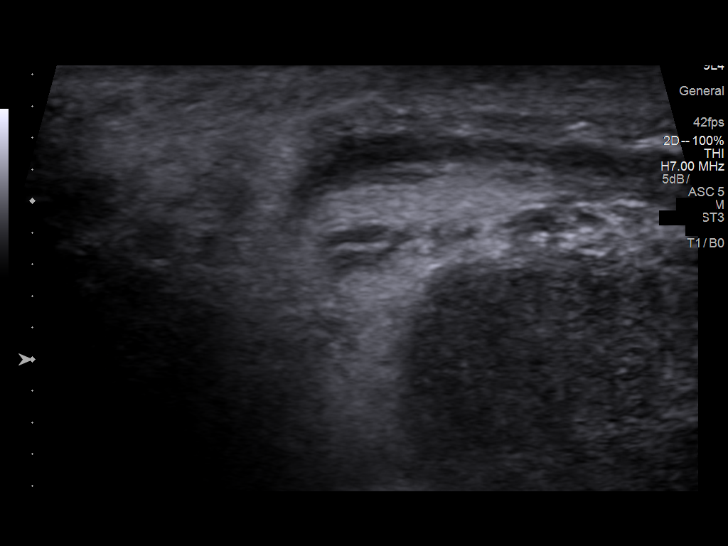

[13 of 25 positions shown; findings below may reference images not displayed]

FINDINGS: Right testicle

Measurements: The right testicle measures 4.2 x 2.9 x 2.8 cm.. No
intratesticular lesion is seen. Blood flow is demonstrated to the
right testicle with arterial and venous waveforms.

Left testicle

Measurements: The left testicle measures 3.9 x 1.8 x 2.5 cm.. No
intratesticular abnormality is seen. Blood flow is demonstrated to
the left testicle with arterial and venous waveforms.

Right epididymis: The right epididymis is prominent, inhomogeneous,
and hypervascular, findings consistent with right epididymitis.

Left epididymis:  The left epididymis is unremarkable.

Hydrocele:  A small amount of fluid is noted on the right.

Varicocele:  No varicocele is seen.

Pulsed Doppler interrogation of both testes demonstrates normal low
resistance arterial and venous waveforms bilaterally.
IMPRESSION: 1. No intratesticular abnormality is seen. Blood flow is
demonstrated to both testicles.
2. Prominent inhomogeneous hypervascular right epididymis most
consistent with right epididymitis.

## 2016-12-12 ENCOUNTER — Other Ambulatory Visit: Payer: Self-pay | Admitting: Medical

## 2016-12-14 NOTE — Telephone Encounter (Signed)
Patient states he is almost out of this medication and needs it ASAP

## 2017-01-24 ENCOUNTER — Ambulatory Visit (INDEPENDENT_AMBULATORY_CARE_PROVIDER_SITE_OTHER): Payer: PRIVATE HEALTH INSURANCE | Admitting: Medical

## 2017-01-24 VITALS — BP 122/64 | HR 80 | Temp 97.8°F | Resp 16 | Ht 63.0 in | Wt 149.6 lb

## 2017-01-24 DIAGNOSIS — Z5181 Encounter for therapeutic drug level monitoring: Secondary | ICD-10-CM

## 2017-01-24 DIAGNOSIS — K2100 Gastro-esophageal reflux disease with esophagitis, without bleeding: Secondary | ICD-10-CM

## 2017-01-24 DIAGNOSIS — J301 Allergic rhinitis due to pollen: Secondary | ICD-10-CM | POA: Diagnosis not present

## 2017-01-24 MED ORDER — AZELASTINE HCL 0.1 % NA SOLN: 2.0000 | Freq: Two times a day (BID) | NASAL | 3 refills | Status: DC

## 2017-01-24 MED ORDER — RANITIDINE HCL 150 MG PO CAPS: 150.0000 mg | ORAL_CAPSULE | Freq: Two times a day (BID) | ORAL | 0 refills | Status: AC

## 2017-01-24 NOTE — Progress Notes (Signed)
Pre visit review using our clinic review tool, if applicable. No additional management support is needed unless otherwise documented below in the visit note. 

## 2017-01-24 NOTE — Patient Instructions (Addendum)
You have some allergic rhinitis type symptoms with  some possible atypical reflux type symptoms.   I want you to continue the xyzal, singulair and flonase. Will add the astelin nasal spray. Will assess if this helps possible pnd sensation in esophagus area.  After using astelin for 3-4 days I want you to start ranitidne if any persiting irritated sensation in upper esophagus region.  Follow up in 3 weeks or as needed.  Note if your symptoms persist might need to refer to specialist for direct visualization of the esophagus.

## 2017-01-24 NOTE — Progress Notes (Signed)
Subjective:    Patient ID: Jerome Sampson, male    DOB: 08/18/1996, 21 y.o.   MRN: [id number]  HPI  Pt in for evaluation.  Pt in states he has slight constant feeling as if something in throat. He states before and after eating. Pt feels like maybe mucous drainage in back of his throat. Denies belching sensation. He states he feel like he had since youth around 21 years old.  Pt states when younger would always be clearing his throat.  Pt also mentioned that when Dr. Verdie Drown did scope evaluation. Pt described what may have been reflux changes? Pt has hx of polyps removed from his nose when younger.  Pt does have some nasal congestion. He is on flonase, xyzal and singulair.    Review of Systems  Constitutional: Negative for chills, fatigue and fever.  HENT: Positive for congestion. Negative for ear pain, facial swelling, postnasal drip, rhinorrhea, sinus pain, sinus pressure, sore throat, trouble swallowing and voice change.   Respiratory: Negative for cough, chest tightness, shortness of breath and wheezing.   Cardiovascular: Negative for chest pain and palpitations.  Gastrointestinal: Negative for abdominal pain.       See hpi.  Genitourinary: Negative for difficulty urinating, dysuria and testicular pain.  Musculoskeletal: Negative for back pain, joint swelling and neck stiffness.  Skin: Negative for rash.  Neurological: Negative for dizziness, numbness and headaches.  Psychiatric/Behavioral: Negative for behavioral problems, confusion and self-injury. The patient is not nervous/anxious.     Past Medical History:  Diagnosis Date  . ADD (attention deficit disorder)   . Allergy   . Asthma   . Headache(784.0)      Social History   Social History  . Marital status: Single    Spouse name: N/A  . Number of children: N/A  . Years of education: N/A   Occupational History  . Not on file.   Social History Main Topics  . Smoking status: Former Games developer  . Smokeless tobacco:  Never Used  . Alcohol use No  . Drug use: No  . Sexual activity: No   Other Topics Concern  . Not on file   Social History Narrative  . No narrative on file    Past Surgical History:  Procedure Laterality Date  . Removal of Sinus Polyps in 2010 & 2012    . TYMPANOSTOMY TUBE PLACEMENT Bilateral    Performed at 21 year of age  . URETHRAL STRICTURE DILATATION     Performed at 1 month of age    Family History  Problem Relation Age of Onset  . Migraines Mother   . Other Mother     Learning Difficulties in Math & Reading   . Migraines Brother     1 Brother has Migraines  . Stroke Paternal Grandfather     Died at 26  . Cancer Paternal Grandmother     Died at 63    No Known Allergies  Current Outpatient Prescriptions on File Prior to Visit  Medication Sig Dispense Refill  . albuterol (PROVENTIL HFA;VENTOLIN HFA) 108 (90 Base) MCG/ACT inhaler Inhale 2 puffs into the lungs every 6 (six) hours as needed for wheezing or shortness of breath. 1 Inhaler 0  . amantadine (SYMMETREL) 100 MG capsule Take 1 capsule (100 mg total) by mouth 2 (two) times daily. 60 capsule 3  . ciprofloxacin (CIPRO) 500 MG tablet Take 1 tablet (500 mg total) by mouth 2 (two) times daily. 20 tablet 0  . fluticasone (FLONASE) 50 MCG/ACT  nasal spray Place 2 sprays into both nostrils daily. 16 g 3  . levocetirizine (XYZAL) 5 MG tablet Take 1 tablet (5 mg total) by mouth every evening. 30 tablet 3  . mirtazapine (REMERON) 15 MG tablet Take 1.5 tablets (22.5 mg total) by mouth at bedtime. 45 tablet 3  . montelukast (SINGULAIR) 10 MG tablet Take 1 tablet (10 mg total) by mouth at bedtime. 30 tablet 3   No current facility-administered medications on file prior to visit.     BP 122/64 (BP Location: Right Arm, Patient Position: Sitting, Cuff Size: Normal)   Pulse 80   Temp 97.8 F (36.6 C) (Oral)   Resp 16   Ht  (1.6 m)   Wt 149 lb 9.6 oz (67.9 kg)   SpO2 100%   BMI 26.50 kg/m       Objective:    Physical Exam  General  Mental Status - Alert. General Appearance - Well groomed. Not in acute distress.  Skin Rashes- No Rashes.  HEENT Head- Normal. Ear Auditory Canal - Left- Normal. Right - Normal.Tympanic Membrane- Left- Normal. Right- Normal. Eye Sclera/Conjunctiva- Left- Normal. Right- Normal. Nose & Sinuses Nasal Mucosa- Left-  Boggy and Congested. Right-  Boggy and  Congested.Bilateral no  maxillary and no  frontal sinus pressure. Mouth & Throat Lips: Upper Lip- Normal: no dryness, cracking, pallor, cyanosis, or vesicular eruption. Lower Lip-Normal: no dryness, cracking, pallor, cyanosis or vesicular eruption. Buccal Mucosa- Bilateral- No Aphthous ulcers. Oropharynx- No Discharge or Erythema. +pnd. Tonsils: Characteristics- Bilateral- No Erythema or Congestion. Size/Enlargement- Bilateral- No enlargement. Discharge- bilateral-None.  Neck Neck- Supple. No Masses. No masses, no swelling.   Chest and Lung Exam Auscultation: Breath Sounds:-Clear even and unlabored.  Cardiovascular Auscultation:Rythm- Regular, rate and rhythm. Murmurs & Other Heart Sounds:Ausculatation of the heart reveal- No Murmurs.  Lymphatic Head & Neck General Head & Neck Lymphatics: Bilateral: Description- No Localized lymphadenopathy.  Abdomen-soft, nt, nd, +bs.       Assessment & Plan:  You have some allergic rhinitis type symptoms with some possible atypical reflux type symptoms.   I want you to continue the xyzal, singulair and flonase. Will add the astelin nasal spray. Will assess if this helps possible pnd sensation in esophagus area.  After using astelin for 3-4 days I want you to start ranitidne if any persiting irritated sensation in upper esophagus region.  Follow up in 3 weeks or as needed.  Note if your symptoms persist might need to refer to specialist for direct visualization of the esophagus.  [place] Jak[place], Ramon Dredge, PA-C

## 2017-01-25 LAB — COMPREHENSIVE METABOLIC PANEL
ALBUMIN: 4.7 g/dL (ref 3.5–5.2)
ALT: 40 U/L (ref 0–53)
AST: 25 U/L (ref 0–37)
Alkaline Phosphatase: 103 U/L (ref 52–171)
BUN: 18 mg/dL (ref 6–23)
CALCIUM: 10 mg/dL (ref 8.4–10.5)
CHLORIDE: 105 meq/L (ref 96–112)
CO2: 32 meq/L (ref 19–32)
CREATININE: 0.79 mg/dL (ref 0.40–1.50)
GFR: 132.95 mL/min (ref >60.00)
Glucose, Bld: 80 mg/dL (ref 70–99)
Potassium: 4.6 mEq/L (ref 3.5–5.1)
Sodium: 141 mEq/L (ref 135–145)
Total Bilirubin: 0.9 mg/dL (ref 0.2–1.2)
Total Protein: 7.4 g/dL (ref 6.0–8.3)

## 2017-02-03 ENCOUNTER — Telehealth: Payer: Self-pay | Admitting: Medical

## 2017-02-03 NOTE — Telephone Encounter (Signed)
I approved refill of remeron. Please notify patient.

## 2017-02-05 NOTE — Telephone Encounter (Signed)
Pt notified rx sent to pharmacy

## 2017-02-14 ENCOUNTER — Other Ambulatory Visit: Payer: Self-pay | Admitting: Medical

## 2017-03-15 ENCOUNTER — Other Ambulatory Visit: Payer: Self-pay | Admitting: Medical

## 2017-04-19 ENCOUNTER — Other Ambulatory Visit: Payer: Self-pay | Admitting: Medical

## 2017-05-26 ENCOUNTER — Other Ambulatory Visit: Payer: Self-pay | Admitting: Medical

## 2017-07-27 ENCOUNTER — Encounter: Payer: Self-pay | Admitting: Medical

## 2017-07-27 ENCOUNTER — Ambulatory Visit (INDEPENDENT_AMBULATORY_CARE_PROVIDER_SITE_OTHER): Payer: PRIVATE HEALTH INSURANCE | Admitting: Medical

## 2017-07-27 VITALS — BP 125/67 | HR 85 | Temp 97.9°F | Resp 16 | Ht 63.0 in | Wt 163.6 lb

## 2017-07-27 DIAGNOSIS — F988 Other specified behavioral and emotional disorders with onset usually occurring in childhood and adolescence: Secondary | ICD-10-CM

## 2017-07-27 DIAGNOSIS — R059 Cough, unspecified: Secondary | ICD-10-CM

## 2017-07-27 DIAGNOSIS — R05 Cough: Secondary | ICD-10-CM | POA: Diagnosis not present

## 2017-07-27 DIAGNOSIS — J301 Allergic rhinitis due to pollen: Secondary | ICD-10-CM

## 2017-07-27 DIAGNOSIS — J0100 Acute maxillary sinusitis, unspecified: Secondary | ICD-10-CM

## 2017-07-27 DIAGNOSIS — Z23 Encounter for immunization: Secondary | ICD-10-CM

## 2017-07-27 MED ORDER — MONTELUKAST SODIUM 10 MG PO TABS: 10.0000 mg | ORAL_TABLET | Freq: Every day | ORAL | 3 refills | Status: DC

## 2017-07-27 MED ORDER — BENZONATATE 100 MG PO CAPS: 100.0000 mg | ORAL_CAPSULE | Freq: Three times a day (TID) | ORAL | 0 refills | Status: AC | PRN

## 2017-07-27 MED ORDER — OMEPRAZOLE 20 MG PO CPDR: 20.0000 mg | DELAYED_RELEASE_CAPSULE | Freq: Every day | ORAL | 1 refills | Status: AC

## 2017-07-27 MED ORDER — AZELASTINE HCL 0.1 % NA SOLN: 2.0000 | Freq: Two times a day (BID) | NASAL | 3 refills | Status: AC

## 2017-07-27 MED ORDER — FLUTICASONE PROPIONATE 50 MCG/ACT NA SUSP: 2.0000 | Freq: Every day | NASAL | 3 refills | Status: DC

## 2017-07-27 MED ORDER — LEVOCETIRIZINE DIHYDROCHLORIDE 5 MG PO TABS: 5.0000 mg | ORAL_TABLET | Freq: Every evening | ORAL | 3 refills | Status: DC

## 2017-07-27 MED ORDER — AZITHROMYCIN 250 MG PO TABS: ORAL_TABLET | ORAL | 0 refills | Status: DC

## 2017-07-27 NOTE — Progress Notes (Signed)
Subjective:    Patient ID: Jerome Sampson, male    DOB: 09/14/1996, 21 y.o.   MRN: [id number]  HPI  Pt in for follow up.  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  Pt states his nose has been running recently over the past week. Some nasal and chest congestion. Blowing out colored mucous and coughing up some mucous. Pt has been on atselin. He has not been using xyzal. Pt admits not using his singulair. Not using any flonase.  He thinks he might need antibiotic.    He feels he no longer needs amantadine. He states his concentration has improved. His former specialist gave him this.  I later filled the prescription when he stopped going to the specialist.  Pt still reports symptom of mucus in lower throat and points to suprasternal notch area. This I thought was atypical sensation and recommended possible ranitidine to see if this helps. He noted no improvement. Last time he described sensation as if something stuck in throat. Denies any abdomen pain.     Review of Systems  Constitutional: Negative for chills, fatigue and fever.  HENT: Positive for congestion, postnasal drip, sinus pain and sinus pressure. Negative for ear pain, sore throat and trouble swallowing.   Respiratory: Negative for cough, choking, shortness of breath and wheezing.   Cardiovascular: Negative for chest pain and palpitations.  Gastrointestinal: Negative for abdominal pain, diarrhea and nausea.  Musculoskeletal: Negative for back pain and myalgias.  Skin: Negative for rash.  Neurological: Negative for dizziness, speech difficulty, weakness, numbness and headaches.  Hematological: Negative for adenopathy. Does not bruise/bleed easily.  Psychiatric/Behavioral: Negative for behavioral problems, confusion and suicidal ideas. The patient is not nervous/anxious.        Concentration better without amantadine.    Past Medical History:  Diagnosis Date  . ADD (attention deficit  disorder)   . Allergy   . Asthma   . Headache(784.0)      Social History   Social History  . Marital status: Single    Spouse name: N/A  . Number of children: N/A  . Years of education: N/A   Occupational History  . Not on file.   Social History Main Topics  . Smoking status: Former Games developermoker  . Smokeless tobacco: Never Used  . Alcohol use No  . Drug use: No  . Sexual activity: No   Other Topics Concern  . Not on file   Social History Narrative  . No narrative on file    Past Surgical History:  Procedure Laterality Date  . Removal of Sinus Polyps in 2010 & 2012    . TYMPANOSTOMY TUBE PLACEMENT Bilateral    Performed at 21 year of age  . URETHRAL STRICTURE DILATATION     Performed at 1 month of age    Family History  Problem Relation Age of Onset  . Migraines Mother   . Other Mother        Learning Difficulties in Math & Reading   . Migraines Brother        1 Brother has Migraines  . Stroke Paternal Grandfather        Died at 7884  . Cancer Paternal Grandmother        Died at 3063    No Known Allergies  Current Outpatient Prescriptions on File Prior to Visit  Medication Sig Dispense Refill  . albuterol (PROVENTIL HFA;VENTOLIN HFA) 108 (90 Base) MCG/ACT inhaler Inhale 2 puffs into the lungs  every 6 (six) hours as needed for wheezing or shortness of breath. 1 Inhaler 0  . amantadine (SYMMETREL) 100 MG capsule Take 1 capsule (100 mg total) by mouth 2 (two) times daily. 60 capsule 3  . azelastine (ASTELIN) 0.1 % nasal spray Place 2 sprays into both nostrils 2 (two) times daily. Use in each nostril as directed 30 mL 3  . ciprofloxacin (CIPRO) 500 MG tablet Take 1 tablet (500 mg total) by mouth 2 (two) times daily. 20 tablet 0  . fluticasone (FLONASE) 50 MCG/ACT nasal spray Place 2 sprays into both nostrils daily. 16 g 3  . levocetirizine (XYZAL) 5 MG tablet Take 1 tablet (5 mg total) by mouth every evening. 30 tablet 3  . mirtazapine (REMERON) 15 MG tablet TAKE 1 1/2  TABLETS BY MOUTH AT BEDTIME 45 tablet 0  . montelukast (SINGULAIR) 10 MG tablet Take 1 tablet (10 mg total) by mouth at bedtime. 30 tablet 3  . ranitidine (ZANTAC) 150 MG capsule Take 1 capsule (150 mg total) by mouth 2 (two) times daily. 60 capsule 0   No current facility-administered medications on file prior to visit.     BP 125/67   Pulse 85   Temp 97.9 F (36.6 C) (Oral)   Resp 16   Ht 5\' 3"  (1.6 m)   Wt 163 lb 9.6 oz (74.2 kg)   SpO2 100%   BMI 28.98 kg/m       Objective:   Physical Exam  General  Mental Status - Alert. General Appearance - Well groomed. Not in acute distress.  Skin Rashes- No Rashes.  HEENT Head- Normal. Ear Auditory Canal - Left- Normal. Right - Normal.Tympanic Membrane- Left- pinkish tm . Right- pinkish tm Eye Sclera/Conjunctiva- Left- Normal. Right- Normal. Nose & Sinuses Nasal Mucosa- Left-  Boggy and Congested. Right-  Boggy and  Congested.Bilateral faint maxillary and frontal sinus pressure. Mouth & Throat Lips: Upper Lip- Normal: no dryness, cracking, pallor, cyanosis, or vesicular eruption. Lower Lip-Normal: no dryness, cracking, pallor, cyanosis or vesicular eruption. Buccal Mucosa- Bilateral- No Aphthous ulcers. Oropharynx- No Discharge or Erythema. +pnd Tonsils: Characteristics- Bilateral- No Erythema or Congestion. Size/Enlargement- Bilateral- No enlargement. Discharge- bilateral-None.  Neck Neck- Supple. No Masses.   Chest and Lung Exam Auscultation: Breath Sounds:-Clear even and unlabored.  Cardiovascular Auscultation:Rythm- Regular, rate and rhythm. Murmurs & Other Heart Sounds:Ausculatation of the heart reveal- No Murmurs.  Lymphatic Head & Neck General Head & Neck Lymphatics: Bilateral: Description- No Localized lymphadenopathy.       Assessment & Plan:  For allergies use your xyzal, flonase, astelin and singulair.  I do think using all 4 of these will help eliminate your described mucous sensation in lower  throat/neck. If not then use omeprazole I am prescribing. If even with omeprazole you have mucous sensation in your throat then will refer you to ENT or GI to visualize are with scope.  For cough benzonatate.   For sinus infection and bronchitis rx azithromycin. This may also help your ears which look may be early infected.  Follow up in 10 days or prn  Pt wants to get flu vaccine. I advised/offered to get next week but he wanted today.    Drue Harr, Ramon Dredge, PA-C

## 2017-07-27 NOTE — Patient Instructions (Addendum)
For allergies use your xyzal, flonase, astelin and singulair.  I do think using all 4 of these will help eliminate your described mucous sensation in lower throat/neck. If not then use omeprazole I am prescribing. If even with omeprazole you have mucous sensation in your throat then will refer you to ENT or GI to visualize are with scope.  For cough benzonatate.   For sinus infection and bronchitis rx azithromycin. This may also help your ears which look may be early infected.  Follow up in 10 days or prn

## 2017-08-01 ENCOUNTER — Other Ambulatory Visit: Payer: Self-pay | Admitting: Medical

## 2017-08-07 ENCOUNTER — Telehealth: Payer: Self-pay | Admitting: Medical

## 2017-08-07 NOTE — Telephone Encounter (Signed)
Patient is requesting more antibiotic for congestion that was treated on 07/27/17. I let patient know that he may need to schedule another office visit. Please advise patient if we are able to give refill.

## 2017-08-08 MED ORDER — DOXYCYCLINE HYCLATE 100 MG PO TABS: 100.0000 mg | ORAL_TABLET | Freq: Two times a day (BID) | ORAL | 0 refills | Status: AC

## 2017-08-08 NOTE — Telephone Encounter (Signed)
Attempted to reach patient, no answer on cell phone.  Called home and dad answered, stated pt is at work.  Dad reports that patient has been feeling run down with chest congestion, has bad insurance and running out of money to be seen.  Advised dad I would send antibiotic to his pharmacy but that patient would need to be seen in office if symptoms worsen or if not improved in 2-3 days.  He verbalizes understanding and agrees to relay message to patient.

## 2017-09-05 ENCOUNTER — Other Ambulatory Visit: Payer: Self-pay | Admitting: Medical

## 2017-09-29 ENCOUNTER — Other Ambulatory Visit: Payer: Self-pay | Admitting: Medical

## 2017-10-01 ENCOUNTER — Telehealth: Payer: Self-pay | Admitting: Medical

## 2017-10-01 NOTE — Telephone Encounter (Signed)
Copied from CRM [phone]. Topic: Quick Communication - See Telephone Encounter >> Oct 01, 2017  8:45 AM Everardo Pacific[place]oton, Pepper Kerrick, [place] wrote: CRM for notification. See Telephone encounter for: Patient father called because his son still needs a refill on his Mirtazapine. Patient and family have been in contact with the pharmacy as well. If someone could give them a call back about this at [phone]. [facility] [address] [place]  10/01/17.

## 2017-10-28 ENCOUNTER — Other Ambulatory Visit: Payer: Self-pay | Admitting: Medical

## 2017-10-29 ENCOUNTER — Telehealth: Payer: Self-pay | Admitting: Medical

## 2017-10-31 NOTE — Telephone Encounter (Signed)
Called patient and Patient is coming in 11/01/2017 @ 6:15pm.

## 2017-10-31 NOTE — Telephone Encounter (Signed)
PT is due for follow up please call and schedule appointment.

## 2017-11-01 ENCOUNTER — Encounter: Payer: Self-pay | Admitting: Medical

## 2017-11-01 ENCOUNTER — Ambulatory Visit (INDEPENDENT_AMBULATORY_CARE_PROVIDER_SITE_OTHER): Payer: PRIVATE HEALTH INSURANCE | Admitting: Medical

## 2017-11-01 VITALS — BP 137/75 | HR 74 | Temp 97.8°F | Resp 16 | Ht 63.0 in | Wt 164.6 lb

## 2017-11-01 DIAGNOSIS — J40 Bronchitis, not specified as acute or chronic: Secondary | ICD-10-CM | POA: Diagnosis not present

## 2017-11-01 DIAGNOSIS — H6690 Otitis media, unspecified, unspecified ear: Secondary | ICD-10-CM | POA: Diagnosis not present

## 2017-11-01 DIAGNOSIS — R05 Cough: Secondary | ICD-10-CM | POA: Diagnosis not present

## 2017-11-01 DIAGNOSIS — J0100 Acute maxillary sinusitis, unspecified: Secondary | ICD-10-CM

## 2017-11-01 DIAGNOSIS — R059 Cough, unspecified: Secondary | ICD-10-CM

## 2017-11-01 MED ORDER — MIRTAZAPINE 15 MG PO TABS
ORAL_TABLET | ORAL | 3 refills | Status: AC
Start: 2017-11-01 — End: ?

## 2017-11-01 MED ORDER — AMOXICILLIN-POT CLAVULANATE 875-125 MG PO TABS: 1.0000 | ORAL_TABLET | Freq: Two times a day (BID) | ORAL | 0 refills | Status: AC

## 2017-11-01 MED ORDER — BENZONATATE 100 MG PO CAPS: 100.0000 mg | ORAL_CAPSULE | Freq: Three times a day (TID) | ORAL | 0 refills | Status: AC | PRN

## 2017-11-01 NOTE — Patient Instructions (Signed)
Your appear to have a sinus infection. I am prescribing  augmentin antibiotic for the infection. To help with the nasal congestion use your flonase nasal steroid. For your associated cough, I prescribed cough medicine benzonatate.  You do have appearance of left ear infection. Augmentin has good ear infection coverage.  Rest, hydrate, tylenol for fever.  Follow up in 7 days or as needed.

## 2017-11-01 NOTE — Progress Notes (Signed)
Subjective:    Patient ID: Durene CalJesse Byard, male    DOB: 08/17/1996, 22 y.o.   MRN: [id number]  HPI   Pt in with some nasal congestion and some productive cough. Pt has some sinus pressure. He feels like he needs to blow out mucus but can't blow out(feels like maxillary sinus are full)  Instead will cough up mucus.  Symptoms above for 2 weeks. Symptoms not improving rapidly. Just lingering.  Pt at this point thinks he needs antibiotic.    Review of Systems  Constitutional: Negative for chills, fatigue and fever.  HENT: Positive for congestion, sinus pressure and sinus pain. Negative for nosebleeds, postnasal drip and rhinorrhea.   Respiratory: Positive for cough. Negative for chest tightness, shortness of breath and wheezing.        Productive  Cardiovascular: Negative for chest pain and palpitations.  Gastrointestinal: Negative for abdominal pain, blood in stool, constipation, diarrhea and vomiting.  Musculoskeletal: Negative for back pain and joint swelling.  Neurological: Negative for dizziness, seizures, syncope and weakness.  Hematological: Negative for adenopathy. Does not bruise/bleed easily.  Psychiatric/Behavioral: Negative for behavioral problems and confusion. The patient is not nervous/anxious.     Past Medical History:  Diagnosis Date  . ADD (attention deficit disorder)   . Allergy   . Asthma   . Headache(784.0)      Social History   Socioeconomic History  . Marital status: Single    Spouse name: Not on file  . Number of children: Not on file  . Years of education: Not on file  . Highest education level: Not on file  Social Needs  . Financial resource strain: Not on file  . Food insecurity - worry: Not on file  . Food insecurity - inability: Not on file  . Transportation needs - medical: Not on file  . Transportation needs - non-medical: Not on file  Occupational History  . Not on file  Tobacco Use  . Smoking status: Former Games developermoker  . Smokeless tobacco:  Never Used  Substance and Sexual Activity  . Alcohol use: No  . Drug use: No  . Sexual activity: No  Other Topics Concern  . Not on file  Social History Narrative  . Not on file    Past Surgical History:  Procedure Laterality Date  . Removal of Sinus Polyps in 2010 & 2012    . TYMPANOSTOMY TUBE PLACEMENT Bilateral    Performed at 22 year of age  . URETHRAL STRICTURE DILATATION     Performed at 1 month of age    Family History  Problem Relation Age of Onset  . Migraines Mother   . Other Mother        Learning Difficulties in Math & Reading   . Migraines Brother        1 Brother has Migraines  . Stroke Paternal Grandfather        Died at 4884  . Cancer Paternal Grandmother        Died at 4463    No Known Allergies  Current Outpatient Medications on File Prior to Visit  Medication Sig Dispense Refill  . albuterol (PROVENTIL HFA;VENTOLIN HFA) 108 (90 Base) MCG/ACT inhaler Inhale 2 puffs into the lungs every 6 (six) hours as needed for wheezing or shortness of breath. 1 Inhaler 0  . azelastine (ASTELIN) 0.1 % nasal spray Place 2 sprays into both nostrils 2 (two) times daily. Use in each nostril as directed 30 mL 3  . benzonatate (TESSALON) 100  MG capsule Take 1 capsule (100 mg total) by mouth 3 (three) times daily as needed for cough. 21 capsule 0  . ciprofloxacin (CIPRO) 500 MG tablet Take 1 tablet (500 mg total) by mouth 2 (two) times daily. 20 tablet 0  . doxycycline (VIBRA-TABS) 100 MG tablet Take 1 tablet (100 mg total) 2 (two) times daily by mouth. 14 tablet 0  . fluticasone (FLONASE) 50 MCG/ACT nasal spray Place 2 sprays into both nostrils daily. 16 g 3  . levocetirizine (XYZAL) 5 MG tablet Take 1 tablet (5 mg total) by mouth every evening. 30 tablet 3  . mirtazapine (REMERON) 15 MG tablet TAKE 1 1/2 TABLETS BY MOUTH AT BEDTIME 45 tablet 0  . montelukast (SINGULAIR) 10 MG tablet Take 1 tablet (10 mg total) by mouth at bedtime. 30 tablet 3  . omeprazole (PRILOSEC) 20 MG  capsule Take 1 capsule (20 mg total) by mouth daily. 30 capsule 1  . ranitidine (ZANTAC) 150 MG capsule Take 1 capsule (150 mg total) by mouth 2 (two) times daily. 60 capsule 0   No current facility-administered medications on file prior to visit.     BP 137/75   Pulse 74   Temp 97.8 F (36.6 C) (Oral)   Resp 16   Ht 5\' 3"  (1.6 m)   Wt 164 lb 9.6 oz (74.7 kg)   SpO2 100%   BMI 29.16 kg/m       Objective:   Physical Exam  General  Mental Status - Alert. General Appearance - Well groomed. Not in acute distress.  Skin Rashes- No Rashes.  HEENT Head- Normal. Ear Auditory Canal - Left- Normal. Right - Normal.Tympanic Membrane- Left- moderate. Right- Normal. Eye Sclera/Conjunctiva- Left- Normal. Right- Normal. Nose & Sinuses Nasal Mucosa- Left-  Boggy and Congested. Right-  Boggy and  Congested.Bilateral maxillary and frontal sinus pressure. Mouth & Throat Lips: Upper Lip- Normal: no dryness, cracking, pallor, cyanosis, or vesicular eruption. Lower Lip-Normal: no dryness, cracking, pallor, cyanosis or vesicular eruption. Buccal Mucosa- Bilateral- No Aphthous ulcers. Oropharynx- No Discharge or Erythema. Tonsils: Characteristics- Bilateral- No Erythema or Congestion. Size/Enlargement- Bilateral- No enlargement. Discharge- bilateral-None.  Neck Neck- Supple. No Masses.   Chest and Lung Exam Auscultation: Breath Sounds:-Clear even and unlabored.  Cardiovascular Auscultation:Rythm- Regular, rate and rhythm. Murmurs & Other Heart Sounds:Ausculatation of the heart reveal- No Murmurs.  Lymphatic Head & Neck General Head & Neck Lymphatics: Bilateral: Description- No Localized lymphadenopathy.       Assessment & Plan:  Your appear to have a sinus infection. I am prescribing  augmentin antibiotic for the infection. To help with the nasal congestion use your flonase nasal steroid. For your associated cough, I prescribed cough medicine benzonatate.  You do have  appearance of left ear infection. Augmentin has good ear infection coverage.  Rest, hydrate, tylenol for fever.  Follow up in 7 days or as needed.  Esperanza Richters, PA-C

## 2017-11-29 ENCOUNTER — Other Ambulatory Visit: Payer: Self-pay | Admitting: Medical

## 2020-10-27 ENCOUNTER — Other Ambulatory Visit: Payer: Self-pay

## 2020-10-27 ENCOUNTER — Telehealth (INDEPENDENT_AMBULATORY_CARE_PROVIDER_SITE_OTHER): Payer: BC Managed Care – PPO | Admitting: Medical

## 2020-10-27 DIAGNOSIS — R0982 Postnasal drip: Secondary | ICD-10-CM | POA: Diagnosis not present

## 2020-10-27 DIAGNOSIS — J309 Allergic rhinitis, unspecified: Secondary | ICD-10-CM

## 2020-10-27 DIAGNOSIS — Z8709 Personal history of other diseases of the respiratory system: Secondary | ICD-10-CM

## 2020-10-27 MED ORDER — MONTELUKAST SODIUM 10 MG PO TABS: 10.0000 mg | ORAL_TABLET | Freq: Every day | ORAL | 0 refills | Status: AC

## 2020-10-27 MED ORDER — LEVOCETIRIZINE DIHYDROCHLORIDE 5 MG PO TABS: 5.0000 mg | ORAL_TABLET | Freq: Every evening | ORAL | 0 refills | Status: AC

## 2020-10-27 MED ORDER — MOMETASONE FUROATE 50 MCG/ACT NA SUSP: 2.0000 | Freq: Every day | NASAL | 5 refills | Status: AC

## 2020-10-27 NOTE — Patient Instructions (Signed)
History of postnasal drainage and mild nasal congestion for 2 months.  In the past had allergic rhinitis and describes history of nasal polyps for which she had to have surgery for.  We will go ahead and write Nasonex nasal spray per patient request.  Also making montelukast and Xyzal available for any persisting symptoms despite use of Nasonex.  Follow-up in 7 to 10 days or as needed  If symptoms persist may be able to see in office since signs/symptoms for 2 months and does not appear to be covid like.

## 2020-10-27 NOTE — Progress Notes (Addendum)
   Subjective:    Patient ID: Jerome Sampson, male    DOB: October 24, 1995, 25 y.o.   MRN: [id number]  HPI  Virtual Visit via Video Note  I connected with Jerome Sampson on 10/27/20 at 10:20 AM EST by a video enabled telemedicine application and verified that I am speaking with the correct person using two identifiers.  Location: Patient: home Provider: office  Participants- pt and myself.   I discussed the limitations of evaluation and management by telemedicine and the availability of in person appointments. The patient expressed understanding and agreed to proceed.  History of Present Illness:  Pt not seen in a while. He works as Catering manager.  Pt has recent post nasal drainage  2 months ago.Marland Kitchen Hx of pnd in the past. He also notes hx of nasal polyps.  He states surgery for nasal polyps in the past.  Hx of allergies in past. 3 years ago he was on xyzal, montelukast and flonase.   Pt wants to try nasonex,     Observations/Objective:  General-no acute distress, pleasant, oriented. Lungs- on inspection lungs appear unlabored. Neck- no tracheal deviation or jvd on inspection. Neuro- gross motor function appears intact.   Assessment and Plan:   Follow Up Instructions:    I discussed the assessment and treatment plan with the patient. The patient was provided an opportunity to ask questions and all were answered. The patient agreed with the plan and demonstrated an understanding of the instructions.   The patient was advised to call back or seek an in-person evaluation if the symptoms worsen or if the condition fails to improve as anticipated.  Time spent with patient today was 20  minutes which consisted of chart revdiew, discussing diagnosis, work up treatment and documentation.   Esperanza Richters, PA-C   Review of Systems  Constitutional: Negative for chills, fatigue and fever.  HENT: Positive for congestion and postnasal drip. Negative for ear pain and  rhinorrhea.   Respiratory: Negative for cough, chest tightness, shortness of breath and wheezing.   Cardiovascular: Negative for chest pain and palpitations.  Gastrointestinal: Negative for anal bleeding.  Musculoskeletal: Negative for back pain.  Skin: Negative for rash.  Neurological: Negative for dizziness, speech difficulty, weakness, numbness and headaches.  Hematological: Negative for adenopathy. Does not bruise/bleed easily.  Psychiatric/Behavioral: Negative for behavioral problems and confusion.       Objective:   .         Assessment & Plan:  History of postnasal drainage and mild nasal congestion for 2 months.  In the past had allergic rhinitis and describes history of nasal polyps for which she had to have surgery for.  We will go ahead and write Nasonex nasal spray per patient request.  Also making montelukast and Xyzal available for any persisting symptoms despite use of Nasonex.  Follow-up in 7 to 10 days or as needed  If symptoms persist may be able to see in office since signs/symptoms for 2 months and does not appear to be covid like.

## 2021-04-07 ENCOUNTER — Ambulatory Visit: Payer: Self-pay | Admitting: Urology

## 2022-03-24 ENCOUNTER — Other Ambulatory Visit (HOSPITAL_BASED_OUTPATIENT_CLINIC_OR_DEPARTMENT_OTHER): Payer: BC Managed Care – PPO

## 2022-03-24 ENCOUNTER — Other Ambulatory Visit (HOSPITAL_BASED_OUTPATIENT_CLINIC_OR_DEPARTMENT_OTHER): Payer: Self-pay

## 2022-03-24 ENCOUNTER — Ambulatory Visit (HOSPITAL_BASED_OUTPATIENT_CLINIC_OR_DEPARTMENT_OTHER): Admission: RE | Admit: 2022-03-24 | Payer: BC Managed Care – PPO | Source: Ambulatory Visit

## 2022-03-24 ENCOUNTER — Other Ambulatory Visit (HOSPITAL_BASED_OUTPATIENT_CLINIC_OR_DEPARTMENT_OTHER): Payer: Self-pay | Admitting: Orthopedic Surgery

## 2022-03-24 DIAGNOSIS — N50811 Right testicular pain: Secondary | ICD-10-CM

## 2022-03-24 DIAGNOSIS — N5089 Other specified disorders of the male genital organs: Secondary | ICD-10-CM

## 2022-03-25 ENCOUNTER — Ambulatory Visit (HOSPITAL_BASED_OUTPATIENT_CLINIC_OR_DEPARTMENT_OTHER)
Admission: RE | Admit: 2022-03-25 | Discharge: 2022-03-25 | Disposition: A | Discharge status: Home / Self Care | Payer: BC Managed Care – PPO | Source: Ambulatory Visit | Attending: Orthopedic Surgery | Admitting: Orthopedic Surgery

## 2022-03-25 DIAGNOSIS — N5089 Other specified disorders of the male genital organs: Secondary | ICD-10-CM
